# Patient Record
Sex: Female | Born: 1995 | Race: White | Hispanic: No | Marital: Single | Smoking: Never smoker
Health system: Southern US, Community
[De-identification: ages and names within clinical notes are randomized; demographics above are authoritative.]

## PROBLEM LIST (undated history)

## (undated) DIAGNOSIS — Z789 Other specified health status: Secondary | ICD-10-CM

## (undated) HISTORY — PX: WISDOM TOOTH EXTRACTION: SHX21

## (undated) HISTORY — PX: NO PAST SURGERIES: SHX2092

---

## 2018-10-17 ENCOUNTER — Telehealth: Payer: Self-pay | Admitting: Family Medicine
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
## 2018-10-17 NOTE — Telephone Encounter (Signed)
Patient wants to know if she can have a prescription sent to her pharmacist instead of being seen in the clinic.

## 2018-10-18 ENCOUNTER — Other Ambulatory Visit: Payer: Self-pay

## 2018-10-18 ENCOUNTER — Ambulatory Visit: Payer: Self-pay | Admitting: Physician Assistant

## 2018-10-18 DIAGNOSIS — Z113 Encounter for screening for infections with a predominantly sexual mode of transmission: Secondary | ICD-10-CM

## 2018-10-18 LAB — WET PREP FOR TRICH, YEAST, CLUE
Trichomonas Exam: NEGATIVE
Yeast Exam: NEGATIVE

## 2018-10-19 ENCOUNTER — Encounter: Payer: Self-pay | Admitting: Physician Assistant

## 2018-10-19 ENCOUNTER — Ambulatory Visit: Payer: Self-pay

## 2018-10-19 NOTE — Progress Notes (Signed)
    STI clinic/screening visit  Subjective:  Diana Mejia is a 23 y.o. female being seen today for an STI screening visit. The patient reports they do have symptoms.  Patient has the following medical conditions:  There are no active problems to display for this patient.    Chief Complaint  Patient presents with  . SEXUALLY TRANSMITTED DISEASE    HPI  Patient reports that she has noticed increased white discharge with odor for a few days and "intense" itching.  States that she was using an OTC product with some relief but symptoms mostly resolved after period started.  LMP 10/16/18 and normal.  States d/c OCs because she ran out about 1 month ago and has appt with Gyn/PCP for PE and renew Rx.  Declines blood work today.  See flowsheet for further details and programmatic requirements.    The following portions of the patient's history were reviewed and updated as appropriate: allergies, current medications, past medical history, past social history, past surgical history and problem list.  Objective:  There were no vitals filed for this visit.  Physical Exam Constitutional:      General: She is not in acute distress.    Appearance: Normal appearance.  HENT:     Head: Normocephalic and atraumatic.     Mouth/Throat:     Mouth: Mucous membranes are moist.     Pharynx: Oropharynx is clear. No oropharyngeal exudate or posterior oropharyngeal erythema.  Eyes:     Conjunctiva/sclera: Conjunctivae normal.  Neck:     Musculoskeletal: Neck supple.  Pulmonary:     Effort: Pulmonary effort is normal.  Abdominal:     Palpations: Abdomen is soft. There is no mass.     Tenderness: There is no abdominal tenderness. There is no guarding or rebound.  Genitourinary:    General: Normal vulva.     Rectum: Normal.     Comments: External genitalia/pubic area without nits, lice, edema, erythema, lesions and inguinal adenopathy. Vagina with normal mucosa and moderate amount of menstrual  bleeding present. Cervix without visible lesions. Uterus firm, mobile, nt, no masses, no CMT, no adnexal tenderness or fullness. Lymphadenopathy:     Cervical: No cervical adenopathy.  Skin:    General: Skin is warm and dry.     Findings: No bruising, erythema, lesion or rash.  Neurological:     Mental Status: She is alert and oriented to person, place, and time.  Psychiatric:        Mood and Affect: Mood normal.        Behavior: Behavior normal.        Thought Content: Thought content normal.        Judgment: Judgment normal.       Assessment and Plan:  Diana Mejia is a 23 y.o. female presenting to the [facility] Department for STI screening  1. Screening for STD (sexually transmitted disease) Patient is without current symptoms.  Declines blood work today. Rec condoms with all sex. Await test results.  Counseled that RN will call if needs to RTC for any treatment once results are back. Enc patient to keep appt with PCP/Gyn as scheduled for PE, pap and BCM. - WET PREP FOR [place], YEAST, CLUE - Chlamydia/Gonorrhea [facility]     No follow-ups on file.  No future appointments.  Jerene Dilling, PA

## 2018-10-30 ENCOUNTER — Telehealth: Payer: Self-pay | Admitting: General Practice

## 2018-10-30 NOTE — Telephone Encounter (Signed)
test results

## 2018-10-31 NOTE — Telephone Encounter (Signed)
TC with patient. Verified ID via password. Informed patient of neg GC/Chlamydia results. Aileen Fass, RN

## 2019-01-01 ENCOUNTER — Other Ambulatory Visit: Payer: Self-pay

## 2019-01-01 ENCOUNTER — Telehealth: Payer: Self-pay | Admitting: Licensed Clinical Social Worker

## 2019-01-01 ENCOUNTER — Telehealth: Payer: Self-pay | Admitting: Family Medicine

## 2019-01-01 ENCOUNTER — Encounter: Payer: Self-pay | Admitting: Physician Assistant

## 2019-01-01 DIAGNOSIS — F419 Anxiety disorder, unspecified: Secondary | ICD-10-CM
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
## 2019-01-01 NOTE — Telephone Encounter (Signed)
Patient needs a referral sent to [facility] . Was told to call us. Explained to patient we are not primary care but a nurse could answer her questions as to the referral type she needs.

## 2019-01-01 NOTE — Telephone Encounter (Signed)
LCSW returned patient's call. LCSW discussed patient's concerns of anxiety/depression and request for medication evaluation. LCSW encouraged patient to start with a PCP and provided information about [facility]. LCSW encouraged patient to reach out in the future, if she decides that she would like therapy.

## 2019-01-01 NOTE — Progress Notes (Signed)
Consulted by RN re:  Patient request for referral to Psych for meds due to increased anxiety and depression.  Rec that patient contact LCSW ASAP for evaluation and referral for meds if needed.  Reviewed RN documentation of phone call and agree with documentation.

## 2019-05-06 ENCOUNTER — Inpatient Hospital Stay (HOSPITAL_COMMUNITY)
Admission: AD | Admit: 2019-05-06 | Discharge: 2019-05-06 | Disposition: A | Discharge status: Home / Self Care | Payer: Medicaid Other | Attending: Obstetrics & Gynecology | Admitting: Obstetrics & Gynecology

## 2019-05-06 ENCOUNTER — Encounter (HOSPITAL_COMMUNITY): Payer: Self-pay | Admitting: Emergency Medicine

## 2019-05-06 ENCOUNTER — Other Ambulatory Visit: Payer: Self-pay

## 2019-05-06 ENCOUNTER — Inpatient Hospital Stay (HOSPITAL_COMMUNITY): Payer: Medicaid Other

## 2019-05-06 DIAGNOSIS — N8311 Corpus luteum cyst of right ovary: Secondary | ICD-10-CM | POA: Diagnosis not present

## 2019-05-06 DIAGNOSIS — O3481 Maternal care for other abnormalities of pelvic organs, first trimester: Secondary | ICD-10-CM | POA: Insufficient documentation

## 2019-05-06 DIAGNOSIS — B9689 Other specified bacterial agents as the cause of diseases classified elsewhere: Secondary | ICD-10-CM | POA: Diagnosis not present

## 2019-05-06 DIAGNOSIS — O26891 Other specified pregnancy related conditions, first trimester: Secondary | ICD-10-CM | POA: Diagnosis not present

## 2019-05-06 DIAGNOSIS — O23591 Infection of other part of genital tract in pregnancy, first trimester: Secondary | ICD-10-CM | POA: Diagnosis not present

## 2019-05-06 DIAGNOSIS — N760 Acute vaginitis: Secondary | ICD-10-CM

## 2019-05-06 DIAGNOSIS — Z880 Allergy status to penicillin: Secondary | ICD-10-CM | POA: Diagnosis not present

## 2019-05-06 DIAGNOSIS — Z3A01 Less than 8 weeks gestation of pregnancy: Secondary | ICD-10-CM | POA: Diagnosis not present

## 2019-05-06 DIAGNOSIS — R109 Unspecified abdominal pain: Secondary | ICD-10-CM | POA: Insufficient documentation

## 2019-05-06 DIAGNOSIS — B373 Candidiasis of vulva and vagina: Secondary | ICD-10-CM

## 2019-05-06 DIAGNOSIS — O26899 Other specified pregnancy related conditions, unspecified trimester: Secondary | ICD-10-CM

## 2019-05-06 DIAGNOSIS — B3731 Acute candidiasis of vulva and vagina: Secondary | ICD-10-CM

## 2019-05-06 DIAGNOSIS — Z3490 Encounter for supervision of normal pregnancy, unspecified, unspecified trimester: Secondary | ICD-10-CM

## 2019-05-06 HISTORY — DX: Other specified health status: Z78.9

## 2019-05-06 LAB — WET PREP, GENITAL
Sperm: NONE SEEN
Trich, Wet Prep: NONE SEEN
Yeast Wet Prep HPF POC: NONE SEEN

## 2019-05-06 LAB — CBC
HCT: 40.6 % (ref 36.0–46.0)
Hemoglobin: 13.3 g/dL (ref 12.0–15.0)
MCH: 27.8 pg (ref 26.0–34.0)
MCHC: 32.8 g/dL (ref 30.0–36.0)
MCV: 84.9 fL (ref 80.0–100.0)
Platelets: 293 10*3/uL (ref 150–400)
RBC: 4.78 MIL/uL (ref 3.87–5.11)
RDW: 12.9 % (ref 11.5–15.5)
WBC: 7.7 10*3/uL (ref 4.0–10.5)
nRBC: 0 % (ref 0.0–0.2)

## 2019-05-06 LAB — URINALYSIS, ROUTINE W REFLEX MICROSCOPIC
Bilirubin Urine: NEGATIVE
Glucose, UA: NEGATIVE mg/dL
Hgb urine dipstick: NEGATIVE
Ketones, ur: 20 mg/dL — AB
Leukocytes,Ua: NEGATIVE
Nitrite: NEGATIVE
Protein, ur: NEGATIVE mg/dL
Specific Gravity, Urine: 1.015 (ref 1.005–1.030)
pH: 5 (ref 5.0–8.0)

## 2019-05-06 LAB — POC URINE PREG, ED
Preg Test, Ur: POSITIVE — AB
Preg Test, Ur: POSITIVE — AB

## 2019-05-06 LAB — HCG, QUANTITATIVE, PREGNANCY: hCG, Beta Chain, Quant, S: 535 m[IU]/mL — ABNORMAL HIGH (ref <5)

## 2019-05-06 MED ORDER — ACETAMINOPHEN 500 MG PO TABS
1000.0000 mg | ORAL_TABLET | Freq: Once | ORAL | Status: AC
  Administered 2019-05-06: 07:00:00 1000 mg via ORAL
  Filled 2019-05-06: qty 2

## 2019-05-06 MED ORDER — METRONIDAZOLE 500 MG PO TABS: 500.0000 mg | ORAL_TABLET | Freq: Two times a day (BID) | ORAL | 0 refills | Status: DC

## 2019-05-06 MED ORDER — TERCONAZOLE 0.4 % VA CREA: 1.0000 | TOPICAL_CREAM | Freq: Every day | VAGINAL | 0 refills | Status: DC

## 2019-05-06 NOTE — ED Triage Notes (Signed)
Pt reports she is [redacted] weeks pregnant and experiencing abdominal cramping.  Only other symptom is nausea.

## 2019-05-06 NOTE — MAU Note (Signed)
Pt reports to MAU c/o abdominal cramping and pressure that is a 5/10. No bleeding or discharge. Pt reports that heat and warm showers are the only things that help on occasion. Pt reports occasional stinging when urinating and frequency. LMP was March 16th 2021.

## 2019-05-06 NOTE — ED Notes (Signed)
RN called PA after positive preg test resulted.  PA will see pt in triage to facilitate transfer to MAU

## 2019-05-06 NOTE — ED Provider Notes (Signed)
MSE was initiated and I personally evaluated the patient and placed orders (if any) at  3:52 AM on May 06, 2019.  The patient appears stable so that the remainder of the MSE may be completed by another provider.  24 year old G1, P0 female who is a last menstrual cycle was 04/01/2019 who presents with bilateral lower abdomen pain and cramping.  Pain has been worsening since onset.  She also endorses nausea.  No vaginal bleeding, discharge, fever, or chills.   States that she stopped taking her OCP in February, but had been taking it on and off for the last few months.  Vital signs are unremarkable in the ER.  On exam, abdomen is gravid.  She is tender to palpation in the bilateral lower quadrants without rebound or guarding.  Well-appearing.  No acute distress.  She is tearful and appears anxious.  Spoke with Gerrit Heck, CNM, who will accept the patient for transfer to MAU.    Barkley Boards, PA-C 05/06/19 0357    Nira Conn, MD 05/06/19 [phone]

## 2019-05-06 NOTE — MAU Provider Note (Signed)
History     CSN: [id number]  Arrival date and time: 05/06/19 0149   First Provider Initiated Contact with Patient 05/06/19 0444      Chief Complaint  Patient presents with  . Abdominal Cramping  . Possible Pregnancy   Ortha Metts is a 24 y.o. G1P0 at [redacted]w[redacted]d Unsure LMP, but thinks her LMP was between March 14-16.  She presents today for Abdominal Cramping.  She reports she has been experiencing lower abdominal pressure for the past 4 days.  She states the pressure is intermittent, but regularly occurs around [place].  She states the pain is so intense that she has to take a shower or apply a heating pad for relief. She reports the pain is worsened by movement.  She reports the pain is a 5/10 and she has not attempted to take any OTC medications for the pain.       OB History    Gravida  1   Para      Term      Preterm      AB      Living        SAB      TAB      Ectopic      Multiple      Live Births              Past Medical History:  Diagnosis Date  . Medical history non-contributory     Past Surgical History:  Procedure Laterality Date  . NO PAST SURGERIES      History reviewed. No pertinent family history.  Social History   Tobacco Use  . Smoking status: Never Smoker  . Smokeless tobacco: Never Used  Substance Use Topics  . Alcohol use: Not Currently    Comment: occasionally  . Drug use: Never    Allergies:  Allergies  Allergen Reactions  . Penicillins Hives    Medications Prior to Admission  Medication Sig Dispense Refill Last Dose  . Prenatal Vit-Fe Fumarate-FA (MULTIVITAMIN-PRENATAL) 27-0.8 MG TABS tablet Take 1 tablet by mouth daily at 12 noon.   05/05/2019 at Unknown time    Review of Systems  Constitutional: Negative for chills and fever.  Respiratory: Negative for cough and shortness of breath.   Gastrointestinal: Positive for abdominal pain and nausea. Negative for constipation, diarrhea and vomiting.  Genitourinary:  Positive for dysuria ("Stinging once or twice," but none currently). Negative for difficulty urinating, pelvic pain, vaginal bleeding and vaginal discharge.  Musculoskeletal: Negative for back pain.  Neurological: Negative for dizziness, light-headedness and headaches (Migraine history-one this week, none currently. ).   Physical Exam   Blood pressure 117/75, pulse 85, temperature 98.6 F (37 C), temperature source Oral, resp. rate 18, height 5\' 3"  (1.6 m), weight 51.4 kg, last menstrual period 04/03/2019, SpO2 99 %.  Physical Exam  Constitutional: She is oriented to person, place, and time. She appears well-developed and well-nourished.  HENT:  Head: Normocephalic and atraumatic.  Eyes: Conjunctivae are normal.  Cardiovascular: Normal rate.  Respiratory: Effort normal.  GI: Soft. There is abdominal tenderness in the right upper quadrant and left upper quadrant.  Genitourinary: Uterus is not enlarged and not tender. Cervix exhibits no motion tenderness and no discharge.    Vaginal discharge (Moderate amt thick white curdy discharge) present.     Genitourinary Comments: Speculum Exam: -Normal External Genitalia: Non tender. Urethra meatus appears inflamed with white plaques noted. Scant amt watery gray discharge noted at introitus.   -Vaginal Vault:  Pink mucosa with good rugae. Moderate amt thick white curdy discharge -wet prep collected -Cervix:Pink, no lesions, cysts, or polyps.  Appears closed, Nulliparous. No active bleeding from os-GC/CT collected -Bimanual Exam:  Normal uterine size. Mild tenderness in left cul de sac. Adnexa without masses or tenderness bilaterally.      Musculoskeletal:        General: Normal range of motion.     Cervical back: Normal range of motion.  Neurological: She is alert and oriented to person, place, and time.  Skin: Skin is warm and dry.  Psychiatric: Her speech is normal and behavior is normal. Thought content normal. Her mood appears anxious.     MAU Course  Procedures Results for orders placed or performed during the hospital encounter of 05/06/19 (from the past 24 hour(s))  POC Urine Pregnancy, ED (not at [facility]     Status: Abnormal   Collection Time: 05/06/19  3:14 AM  Result Value Ref Range   Preg Test, Ur POSITIVE (A) NEGATIVE  POC urine preg, ED (not at [facility])     Status: Abnormal   Collection Time: 05/06/19  3:25 AM  Result Value Ref Range   Preg Test, Ur POSITIVE (A) NEGATIVE  Wet prep, genital     Status: Abnormal   Collection Time: 05/06/19  5:03 AM  Result Value Ref Range   Yeast Wet Prep HPF POC NONE SEEN NONE SEEN   Trich, Wet Prep NONE SEEN NONE SEEN   Clue Cells Wet Prep HPF POC PRESENT (A) NONE SEEN   WBC, Wet Prep HPF POC MANY (A) NONE SEEN   Sperm NONE SEEN   Urinalysis, Routine w reflex microscopic     Status: Abnormal   Collection Time: 05/06/19  5:09 AM  Result Value Ref Range   Color, Urine YELLOW YELLOW   APPearance CLEAR CLEAR   Specific Gravity, Urine 1.015 1.005 - 1.030   pH 5.0 5.0 - 8.0   Glucose, UA NEGATIVE NEGATIVE mg/dL   Hgb urine dipstick NEGATIVE NEGATIVE   Bilirubin Urine NEGATIVE NEGATIVE   Ketones, ur 20 (A) NEGATIVE mg/dL   Protein, ur NEGATIVE NEGATIVE mg/dL   Nitrite NEGATIVE NEGATIVE   Leukocytes,Ua NEGATIVE NEGATIVE  ABO/Rh     Status: None   Collection Time: 05/06/19  5:27 AM  Result Value Ref Range   ABO/RH(D)      A NEG Performed at [facility], [address] [place], [place] [address]    US OB LESS THAN 14 WEEKS WITH [place] TRANSVAGINAL  Result Date: 05/06/2019 CLINICAL DATA:  Abdominal pain. Beta hCG pending. Four weeks 5 days by last menstrual. EXAM: OBSTETRIC <14 WK [place] AND TRANSVAGINAL OB US TECHNIQUE: Both transabdominal and transvaginal ultrasound examinations were performed for complete evaluation of the gestation as well as the maternal uterus, adnexal regions, and pelvic cul-de-sac. Transvaginal technique was performed to assess early pregnancy.  COMPARISON:  None. FINDINGS: Intrauterine gestational sac: Absent Maternal uterus/adnexae: Normal left ovary. A right ovarian corpus luteal cyst measures on the order of 3.0 cm. Trace free pelvic fluid is likely physiologic. IMPRESSION: Absent intrauterine gestational sac. Given lack of beta HCG and early gestational age by last menstrual, this most likely represents an early intrauterine pregnancy. Missed abortion or otherwise occult ectopic cannot be excluded. Right ovarian corpus luteal cyst. Electronically Signed   By: Jeronimo Greaves M.D.   On: 05/06/2019 06:27    MDM Pelvic Exam; Wet Prep and GC/CT Labs: UA, UPT, CBC, hCG, ABO Ultrasound Pain  Medication Assessment and Plan  24 year old  G1P0 at 4.5 weeks Abdominal Pain  -POC reviewed. -Introduced to instruments for pelvic exam. -Exam performed and findings discussed. -Informed that findings reflective of yeast and patient reports some intermittent vaginal itching. -Discussed that treatment would be sent regardless of microscopic findings.  -Cultures collected and pending.  -Labs ordered -Patient offered and accepts pain medication. -Tylenol ordered -Will send for [place] and await results.   Cherre Robins 05/06/2019, 4:44 AM   Reassessment (6:43 AM) Bacterial Vaginosis Early Pregnancy-Unknown Anatomical Location  -Labs and [place] results reviewed. -Due to laboratory back up, hCG and CBC has yet to be located, but drawn by phlebotomist as witnessed by RN. -Provider to bedside to discuss results and POC. -Patient reports improvement in pain.  -Patient informed that labs still pending and if unable to result, she will be contacted by provider on Monday morning for repeat lab at [facility].  -Otherwise patient should plan for repeat quant on Tuesday at [facility].  -Patient agreeable with plan and scheduled for Tuesday April 20th at 0830am -Patient questions what she needs to do to activate pregnancy medicaid and informed that  verification paperwork would be given.  -Discussed bacterial vaginosis findings and treatment.  Instructed to treat BV first and then yeast infection. -Rx for Flagyl and Terazol 7 sent to pharmacy on file.  -Patient expresses gratitude for care received today and has no other questions or concerns. -Encouraged to call or return to MAU if symptoms worsen or with the onset of new symptoms. -Discharged to home in improved condition.  Cherre Robins MSN, CNM Advanced Practice Provider, Center for [facility]   6:49 AM CBC and hCG located and pending

## 2019-05-06 NOTE — Discharge Instructions (Signed)
Vaginal Yeast Infection, Adult  Vaginal yeast infection is a condition that causes vaginal discharge as well as soreness, swelling, and redness (inflammation) of the vagina. This is a common condition. Some women get this infection frequently. What are the causes? This condition is caused by a change in the normal balance of the yeast (candida) and bacteria that live in the vagina. This change causes an overgrowth of yeast, which causes the inflammation. What increases the risk? The condition is more likely to develop in women who:  Take antibiotic medicines.  Have diabetes.  Take birth control pills.  Are pregnant.  Douche often.  Have a weak body defense system (immune system).  Have been taking steroid medicines for a long time.  Frequently wear tight clothing. What are the signs or symptoms? Symptoms of this condition include:  White, thick, creamy vaginal discharge.  Swelling, itching, redness, and irritation of the vagina. The lips of the vagina (vulva) may be affected as well.  Pain or a burning feeling while urinating.  Pain during sex. How is this diagnosed? This condition is diagnosed based on:  Your medical history.  A physical exam.  A pelvic exam. Your health care provider will examine a sample of your vaginal discharge under a microscope. Your health care provider may send this sample for testing to confirm the diagnosis. How is this treated? This condition is treated with medicine. Medicines may be over-the-counter or prescription. You may be told to use one or more of the following:  Medicine that is taken by mouth (orally).  Medicine that is applied as a cream (topically).  Medicine that is inserted directly into the vagina (suppository). Follow these instructions at home:  Lifestyle  Do not have sex until your health care provider approves. Tell your sex partner that you have a yeast infection. That person should go to his or her health care  provider and ask if they should also be treated.  Do not wear tight clothes, such as pantyhose or tight pants.  Wear breathable cotton underwear. General instructions  Take or apply over-the-counter and prescription medicines only as told by your health care provider.  Eat more yogurt. This may help to keep your yeast infection from returning.  Do not use tampons until your health care provider approves.  Try taking a sitz bath to help with discomfort. This is a warm water bath that is taken while you are sitting down. The water should only come up to your hips and should cover your buttocks. Do this 3-4 times per day or as told by your health care provider.  Do not douche.  If you have diabetes, keep your blood sugar levels under control.  Keep all follow-up visits as told by your health care provider. This is important. Contact a health care provider if:  You have a fever.  Your symptoms go away and then return.  Your symptoms do not get better with treatment.  Your symptoms get worse.  You have new symptoms.  You develop blisters in or around your vagina.  You have blood coming from your vagina and it is not your menstrual period.  You develop pain in your abdomen. Summary  Vaginal yeast infection is a condition that causes discharge as well as soreness, swelling, and redness (inflammation) of the vagina.  This condition is treated with medicine. Medicines may be over-the-counter or prescription.  Take or apply over-the-counter and prescription medicines only as told by your health care provider.  Do not douche.   Do not have sex or use tampons until your health care provider approves.  Contact a health care provider if your symptoms do not get better with treatment or your symptoms go away and then return. This information is not intended to replace advice given to you by your health care provider. Make sure you discuss any questions you have with your health care  provider. Document Revised: 08/04/2018 Document Reviewed: 05/23/2017 Elsevier Patient Education  [place]. Bacterial Vaginosis  Bacterial vaginosis is a vaginal infection that occurs when the normal balance of bacteria in the vagina is disrupted. It results from an overgrowth of certain bacteria. This is the most common vaginal infection among women ages 75-44. Because bacterial vaginosis increases your risk for STIs (sexually transmitted infections), getting treated can help reduce your risk for chlamydia, gonorrhea, herpes, and HIV (human immunodeficiency virus). Treatment is also important for preventing complications in pregnant women, because this condition can cause an early (premature) delivery. What are the causes? This condition is caused by an increase in harmful bacteria that are normally present in small amounts in the vagina. However, the reason that the condition develops is not fully understood. What increases the risk? The following factors may make you more likely to develop this condition:  Having a new sexual partner or multiple sexual partners.  Having unprotected sex.  Douching.  Having an intrauterine device (IUD).  Smoking.  Drug and alcohol abuse.  Taking certain antibiotic medicines.  Being pregnant. You cannot get bacterial vaginosis from toilet seats, bedding, swimming pools, or contact with objects around you. What are the signs or symptoms? Symptoms of this condition include:  Grey or white vaginal discharge. The discharge can also be watery or foamy.  A fish-like odor with discharge, especially after sexual intercourse or during menstruation.  Itching in and around the vagina.  Burning or pain with urination. Some women with bacterial vaginosis have no signs or symptoms. How is this diagnosed? This condition is diagnosed based on:  Your medical history.  A physical exam of the vagina.  Testing a sample of vaginal fluid under a  microscope to look for a large amount of bad bacteria or abnormal cells. Your health care provider may use a cotton swab or a small wooden spatula to collect the sample. How is this treated? This condition is treated with antibiotics. These may be given as a pill, a vaginal cream, or a medicine that is put into the vagina (suppository). If the condition comes back after treatment, a second round of antibiotics may be needed. Follow these instructions at home: Medicines  Take over-the-counter and prescription medicines only as told by your health care provider.  Take or use your antibiotic as told by your health care provider. Do not stop taking or using the antibiotic even if you start to feel better. General instructions  If you have a female sexual partner, tell her that you have a vaginal infection. She should see her health care provider and be treated if she has symptoms. If you have a female sexual partner, he does not need treatment.  During treatment: ? Avoid sexual activity until you finish treatment. ? Do not douche. ? Avoid alcohol as directed by your health care provider. ? Avoid breastfeeding as directed by your health care provider.  Drink enough water and fluids to keep your urine clear or pale yellow.  Keep the area around your vagina and rectum clean. ? Wash the area daily with warm water. ? Wipe  yourself from front to back after using the toilet.  Keep all follow-up visits as told by your health care provider. This is important. How is this prevented?  Do not douche.  Wash the outside of your vagina with warm water only.  Use protection when having sex. This includes latex condoms and dental dams.  Limit how many sexual partners you have. To help prevent bacterial vaginosis, it is best to have sex with just one partner (monogamous).  Make sure you and your sexual partner are tested for STIs.  Wear cotton or cotton-lined underwear.  Avoid wearing tight pants and  pantyhose, especially during summer.  Limit the amount of alcohol that you drink.  Do not use any products that contain nicotine or tobacco, such as cigarettes and e-cigarettes. If you need help quitting, ask your health care provider.  Do not use illegal drugs. Where to find more information  Centers for Disease Control and Prevention: SolutionApps.co.za  American Sexual Health Association (ASHA): www.ashastd.org  [place] Department of Health and Health and safety inspector, Office on Women's Health: ConventionalMedicines.si or http://www.anderson-williamson.info/ Contact a health care provider if:  Your symptoms do not improve, even after treatment.  You have more discharge or pain when urinating.  You have a fever.  You have pain in your abdomen.  You have pain during sex.  You have vaginal bleeding between periods. Summary  Bacterial vaginosis is a vaginal infection that occurs when the normal balance of bacteria in the vagina is disrupted.  Because bacterial vaginosis increases your risk for STIs (sexually transmitted infections), getting treated can help reduce your risk for chlamydia, gonorrhea, herpes, and HIV (human immunodeficiency virus). Treatment is also important for preventing complications in pregnant women, because the condition can cause an early (premature) delivery.  This condition is treated with antibiotic medicines. These may be given as a pill, a vaginal cream, or a medicine that is put into the vagina (suppository). This information is not intended to replace advice given to you by your health care provider. Make sure you discuss any questions you have with your health care provider. Document Revised: 12/17/2016 Document Reviewed: 09/20/2015 Elsevier Patient Education  2020 [employer]. Human Chorionic Gonadotropin Test Why am I having this test? A human chorionic gonadotropin (hCG) test is done to determine whether you are pregnant. It can also be  used:  To diagnose an abnormal pregnancy.  To determine whether you have had a failed pregnancy (miscarriage) or are at risk of one. What is being tested? This test checks the level of the human chorionic gonadotropin (hCG) hormone in the blood. This hormone is produced during pregnancy by the cells that form the placenta. The placenta is the organ that grows inside your womb (uterus) to nourish a developing baby. When you are pregnant, hCG can be detected in your blood or urine 7 to 8 days before your missed period. It continues to go up for the first 8-10 weeks of pregnancy. The presence of hCG in your blood can be measured with several different types of tests. You may have:  A urine test. ? Because this hormone is eliminated from your body by your kidneys, you may have a urine test to find out whether you are pregnant. A home pregnancy test detects whether there is hCG in your urine. ? A urine test only shows whether there is hCG in your urine. It does not measure how much.  A qualitative blood test. ? You may have this type of blood test to  find out if you are pregnant. ? This blood test only shows whether there is hCG in your blood. It does not measure how much.  A quantitative blood test. ? This type of blood test measures the amount of hCG in your blood. ? You may have this test to:  Diagnose an abnormal pregnancy.  Check whether you have had a miscarriage.  Determine whether you are at risk of a miscarriage. What kind of sample is taken?     Two kinds of samples may be collected to test for the hCG hormone.  Blood. It is usually collected by inserting a needle into a blood vessel.  Urine. It is usually collected by urinating into a germ-free (sterile) specimen cup. It is best to collect the sample the first time you urinate in the morning. How do I prepare for this test? No preparation is needed for a blood test.  For the urine test:  Let your health care provider know  about: ? All medicines you are taking, including vitamins, herbs, creams, and over-the-counter medicines. ? Any blood in your urine. This may interfere with the result.  Do not drink too much fluid. Drink as you normally would, or as directed by your health care provider. How are the results reported? Depending on the type of test that you have, your test results may be reported as values. Your health care provider will compare your results to normal ranges that were established after testing a large group of people (reference ranges). Reference ranges may vary among labs and hospitals. For this test, common reference ranges that show absence of pregnancy are:  Quantitative hCG blood levels: less than 5 IU/L. Other results will be reported as either positive or negative. For this test, normal results (meaning the absence of pregnancy) are:  Negative for hCG in the urine test.  Negative for hCG in the qualitative blood test. What do the results mean? Urine and qualitative blood test  A negative result could mean: ? That you are not pregnant. ? That the test was done too early in your pregnancy to detect hCG in your blood or urine. If you still have other signs of pregnancy, the test will be repeated.  A positive result means: ? That you are most likely pregnant. Your health care provider may confirm your pregnancy with an imaging study (ultrasound) of your uterus, if needed. Quantitative blood test Results of the quantitative hCG blood test will be interpreted as follows:  Less than 5 IU/L: You are most likely not pregnant.  Greater than 25 IU/L: You are most likely pregnant.  hCG levels that are higher than expected: ? You are pregnant with twins. ? You have abnormal growths in the uterus.  hCG levels that are rising more slowly than expected: ? You have an ectopic pregnancy (also called a tubal pregnancy).  hCG levels that are falling: ? You may be having a miscarriage. Talk  with your health care provider about what your results mean. Questions to ask your health care provider Ask your health care provider, or the department that is doing the test:  When will my results be ready?  How will I get my results?  What are my treatment options?  What other tests do I need?  What are my next steps? Summary  A human chorionic gonadotropin test is done to determine whether you are pregnant.  When you are pregnant, hCG can be detected in your blood or urine 7 to 8 days  before your missed period. It continues to go up for the first 8-10 weeks of pregnancy.  Your hCG level can be measured with different types of tests. You may have a urine test, a qualitative blood test, or a quantitative blood test.  Talk with your health care provider about what your results mean. This information is not intended to replace advice given to you by your health care provider. Make sure you discuss any questions you have with your health care provider. Document Revised: 12/06/2016 Document Reviewed: 12/06/2016 Elsevier Patient Education  2020 [employer]. Advance Directive  Advance directives are legal documents that let you make choices ahead of time about your health care and medical treatment in case you become unable to communicate for yourself. Advance directives are a way for you to make known your wishes to family, friends, and health care providers. This can let others know about your end-of-life care if you become unable to communicate. Discussing and writing advance directives should happen over time rather than all at once. Advance directives can be changed depending on your situation and what you want, even after you have signed the advance directives. There are different types of advance directives, such as:  Medical power of attorney.  Living will.  Do not resuscitate (DNR) or do not attempt resuscitation (DNAR) order. Health care proxy and medical power of  attorney A health care proxy is also called a health care agent. This is a person who is appointed to make medical decisions for you in cases where you are unable to make the decisions yourself. Generally, people choose someone they know well and trust to represent their preferences. Make sure to ask this person for an agreement to act as your proxy. A proxy may have to exercise judgment in the event of a medical decision for which your wishes are not known. A medical power of attorney is a legal document that names your health care proxy. Depending on the laws in your state, after the document is written, it may also need to be:  Signed.  Notarized.  Dated.  Copied.  Witnessed.  Incorporated into your medical record. You may also want to appoint someone to manage your money in a situation in which you are unable to do so. This is called a durable power of attorney for finances. It is a separate legal document from the durable power of attorney for health care. You may choose the same person or someone different from your health care proxy to act as your agent in money matters. If you do not appoint a proxy, or if there is a concern that the proxy is not acting in your best interests, a court may appoint a guardian to act on your behalf. Living will A living will is a set of instructions that state your wishes about medical care when you cannot express them yourself. Health care providers should keep a copy of your living will in your medical record. You may want to give a copy to family members or friends. To alert caregivers in case of an emergency, you can place a card in your wallet to let them know that you have a living will and where they can find it. A living will is used if you become:  Terminally ill.  Disabled.  Unable to communicate or make decisions. Items to consider in your living will include:  To use or not to use life-support equipment, such as dialysis machines and  breathing machines (ventilators).  A DNR or DNAR order. This tells health care providers not to use cardiopulmonary resuscitation (CPR) if breathing or heartbeat stops.  To use or not to use tube feeding.  To be given or not to be given food and fluids.  Comfort (palliative) care when the goal becomes comfort rather than a cure.  Donation of organs and tissues. A living will does not give instructions for distributing your money and property if you should pass away. DNR or DNAR A DNR or DNAR order is a request not to have CPR in the event that your heart stops beating or you stop breathing. If a DNR or DNAR order has not been made and shared, a health care provider will try to help any patient whose heart has stopped or who has stopped breathing. If you plan to have surgery, talk with your health care provider about how your DNR or DNAR order will be followed if problems occur. What if I do not have an advance directive? If you do not have an advance directive, some states assign family decision makers to act on your behalf based on how closely you are related to them. Each state has its own laws about advance directives. You may want to check with your health care provider, attorney, or state representative about the laws in your state. Summary  Advance directives are the legal documents that allow you to make choices ahead of time about your health care and medical treatment in case you become unable to tell others about your care.  The process of discussing and writing advance directives should happen over time. You can change the advance directives, even after you have signed them.  Advance directives include DNR or DNAR orders, living wills, and designating an agent as your medical power of attorney. This information is not intended to replace advice given to you by your health care provider. Make sure you discuss any questions you have with your health care provider. Document Revised:  08/03/2018 Document Reviewed: 08/03/2018 Elsevier Patient Education  [place].

## 2019-05-06 NOTE — ED Notes (Signed)
PA informed RN that pt has been accepted by [facility].  RN called report to [facility] RN.  Transport is coming to pick up pt.

## 2019-05-07 LAB — ABO/RH
ABO/RH(D): A NEG
Antibody Screen: NEGATIVE

## 2019-05-07 LAB — GC/CHLAMYDIA PROBE AMP (~~LOC~~) NOT AT ARMC
Chlamydia: NEGATIVE
Comment: NEGATIVE
Comment: NORMAL
Neisseria Gonorrhea: NEGATIVE

## 2019-05-08 ENCOUNTER — Ambulatory Visit (INDEPENDENT_AMBULATORY_CARE_PROVIDER_SITE_OTHER): Payer: 59 | Admitting: *Deleted

## 2019-05-08 ENCOUNTER — Other Ambulatory Visit: Payer: Self-pay

## 2019-05-08 DIAGNOSIS — Z3200 Encounter for pregnancy test, result unknown: Secondary | ICD-10-CM

## 2019-05-08 LAB — HCG, QUANTITATIVE, PREGNANCY: HCG, Total, QN: 1650 m[IU]/mL

## 2019-05-08 NOTE — Progress Notes (Signed)
Pt here for [facility] lab only

## 2019-05-12 ENCOUNTER — Encounter (HOSPITAL_COMMUNITY): Payer: Self-pay | Admitting: Obstetrics and Gynecology

## 2019-05-12 ENCOUNTER — Inpatient Hospital Stay (HOSPITAL_COMMUNITY)
Admission: AD | Admit: 2019-05-12 | Discharge: 2019-05-12 | Disposition: A | Discharge status: Home / Self Care | Payer: Medicaid Other | Attending: Obstetrics and Gynecology | Admitting: Obstetrics and Gynecology

## 2019-05-12 ENCOUNTER — Inpatient Hospital Stay (HOSPITAL_COMMUNITY): Payer: Medicaid Other

## 2019-05-12 ENCOUNTER — Other Ambulatory Visit: Payer: Self-pay

## 2019-05-12 DIAGNOSIS — R1031 Right lower quadrant pain: Secondary | ICD-10-CM | POA: Insufficient documentation

## 2019-05-12 DIAGNOSIS — O26891 Other specified pregnancy related conditions, first trimester: Secondary | ICD-10-CM | POA: Diagnosis not present

## 2019-05-12 DIAGNOSIS — Z880 Allergy status to penicillin: Secondary | ICD-10-CM | POA: Diagnosis not present

## 2019-05-12 DIAGNOSIS — R109 Unspecified abdominal pain: Secondary | ICD-10-CM | POA: Diagnosis present

## 2019-05-12 DIAGNOSIS — Z3490 Encounter for supervision of normal pregnancy, unspecified, unspecified trimester: Secondary | ICD-10-CM

## 2019-05-12 DIAGNOSIS — Z3A01 Less than 8 weeks gestation of pregnancy: Secondary | ICD-10-CM | POA: Diagnosis not present

## 2019-05-12 DIAGNOSIS — O208 Other hemorrhage in early pregnancy: Secondary | ICD-10-CM | POA: Diagnosis not present

## 2019-05-12 NOTE — Discharge Instructions (Signed)
First Trimester of Pregnancy  The first trimester of pregnancy is from week 1 until the end of week 13 (months 1 through 3). During this time, your baby will begin to develop inside you. At 6-8 weeks, the eyes and face are formed, and the heartbeat can be seen on ultrasound. At the end of 12 weeks, all the baby's organs are formed. Prenatal care is all the medical care you receive before the birth of your baby. Make sure you get good prenatal care and follow all of your doctor's instructions. Follow these instructions at home: Medicines  Take over-the-counter and prescription medicines only as told by your doctor. Some medicines are safe and some medicines are not safe during pregnancy.  Take a prenatal vitamin that contains at least 600 micrograms (mcg) of folic acid.  If you have trouble pooping (constipation), take medicine that will make your stool soft (stool softener) if your doctor approves. Eating and drinking   Eat regular, healthy meals.  Your doctor will tell you the amount of weight gain that is right for you.  Avoid raw meat and uncooked cheese.  If you feel sick to your stomach (nauseous) or throw up (vomit): ? Eat 4 or 5 small meals a day instead of 3 large meals. ? Try eating a few soda crackers. ? Drink liquids between meals instead of during meals.  To prevent constipation: ? Eat foods that are high in fiber, like fresh fruits and vegetables, whole grains, and beans. ? Drink enough fluids to keep your pee (urine) clear or pale yellow. Activity  Exercise only as told by your doctor. Stop exercising if you have cramps or pain in your lower belly (abdomen) or low back.  Do not exercise if it is too hot, too humid, or if you are in a place of great height (high altitude).  Try to avoid standing for long periods of time. Move your legs often if you must stand in one place for a long time.  Avoid heavy lifting.  Wear low-heeled shoes. Sit and stand up  straight.  You can have sex unless your doctor tells you not to. Relieving pain and discomfort  Wear a good support bra if your breasts are sore.  Take warm water baths (sitz baths) to soothe pain or discomfort caused by hemorrhoids. Use hemorrhoid cream if your doctor says it is okay.  Rest with your legs raised if you have leg cramps or low back pain.  If you have puffy, bulging veins (varicose veins) in your legs: ? Wear support hose or compression stockings as told by your doctor. ? Raise (elevate) your feet for 15 minutes, 3-4 times a day. ? Limit salt in your food. Prenatal care  Schedule your prenatal visits by the twelfth week of pregnancy.  Write down your questions. Take them to your prenatal visits.  Keep all your prenatal visits as told by your doctor. This is important. Safety  Wear your seat belt at all times when driving.  Make a list of emergency phone numbers. The list should include numbers for family, friends, the hospital, and police and fire departments. General instructions  Ask your doctor for a referral to a local prenatal class. Begin classes no later than at the start of month 6 of your pregnancy.  Ask for help if you need counseling or if you need help with nutrition. Your doctor can give you advice or tell you where to go for help.  Do not use hot tubs, steam   rooms, or saunas.  Do not douche or use tampons or scented sanitary pads.  Do not cross your legs for long periods of time.  Avoid all herbs and alcohol. Avoid drugs that are not approved by your doctor.  Do not use any tobacco products, including cigarettes, chewing tobacco, and electronic cigarettes. If you need help quitting, ask your doctor. You may get counseling or other support to help you quit.  Avoid cat litter boxes and soil used by cats. These carry germs that can cause birth defects in the baby and can cause a loss of your baby (miscarriage) or stillbirth.  Visit your dentist.  At home, brush your teeth with a soft toothbrush. Be gentle when you floss. Contact a doctor if:  You are dizzy.  You have mild cramps or pressure in your lower belly.  You have a nagging pain in your belly area.  You continue to feel sick to your stomach, you throw up, or you have watery poop (diarrhea).  You have a bad smelling fluid coming from your vagina.  You have pain when you pee (urinate).  You have increased puffiness (swelling) in your face, hands, legs, or ankles. Get help right away if:  You have a fever.  You are leaking fluid from your vagina.  You have spotting or bleeding from your vagina.  You have very bad belly cramping or pain.  You gain or lose weight rapidly.  You throw up blood. It may look like coffee grounds.  You are around people who have [place] measles, fifth disease, or chickenpox.  You have a very bad headache.  You have shortness of breath.  You have any kind of trauma, such as from a fall or a car accident. Summary  The first trimester of pregnancy is from week 1 until the end of week 13 (months 1 through 3).  To take care of yourself and your unborn baby, you will need to eat healthy meals, take medicines only if your doctor tells you to do so, and do activities that are safe for you and your baby.  Keep all follow-up visits as told by your doctor. This is important as your doctor will have to ensure that your baby is healthy and growing well. This information is not intended to replace advice given to you by your health care provider. Make sure you discuss any questions you have with your health care provider. Document Revised: 04/27/2018 Document Reviewed: 01/13/2016 Elsevier Patient Education  2020 [employer] Inc[employer]   Subchorionic Hematoma  A subchorionic hematoma is a gathering of blood between the outer wall of the embryo (chorion) and the inner wall of the womb (uterus). This condition can cause vaginal bleeding. If they cause  little or no vaginal bleeding, early small hematomas usually shrink on their own and do not affect your baby or pregnancy.  What are the causes? The exact cause of this condition is not known. It occurs when blood is trapped between the placenta and the uterine wall because the placenta has separated from the original site of implantation. What increases the risk? You are more likely to develop this condition if:  You were treated with fertility medicines.  You conceived through in vitro fertilization (IVF). What are the signs or symptoms? Symptoms of this condition include:  Vaginal spotting or bleeding.  Contractions of the uterus. These cause abdominal pain. Sometimes you may have no symptoms and the bleeding may only be seen when ultrasound images are taken (transvaginal ultrasound).  How is this diagnosed? This condition is diagnosed based on a physical exam. This includes a pelvic exam. You may also have other tests, including:  Blood tests.  Urine tests.  Ultrasound of the abdomen. How is this treated? Treatment for this condition can vary. Treatment may include:  Watchful waiting. You will be monitored closely for any changes in bleeding. During this stage: ? The hematoma may be reabsorbed by the body. ? The hematoma may separate the fluid-filled space containing the embryo (gestational sac) from the wall of the womb (endometrium).  Medicines.  Activity restriction. This may be needed until the bleeding stops. Follow these instructions at home:  Stay on bed rest if told to do so by your health care provider.  Do not lift anything that is heavier than 10 lbs. (4.5 kg) or as told by your health care provider.  Do not use any products that contain nicotine or tobacco, such as cigarettes and e-cigarettes. If you need help quitting, ask your health care provider.  Track and write down the number of pads you use each day and how soaked (saturated) they are.  Do not use  tampons.  Keep all follow-up visits as told by your health care provider. This is important. Your health care provider may ask you to have follow-up blood tests or ultrasound tests or both. Contact a health care provider if:  You have any vaginal bleeding.  You have a fever. Get help right away if:  You have severe cramps in your stomach, back, abdomen, or pelvis.  You pass large clots or tissue. Save any tissue for your health care provider to look at.  You have more vaginal bleeding, and you faint or become lightheaded or weak. Summary  A subchorionic hematoma is a gathering of blood between the outer wall of the placenta and the uterus.  This condition can cause vaginal bleeding.  Sometimes you may have no symptoms and the bleeding may only be seen when ultrasound images are taken.  Treatment may include watchful waiting, medicines, or activity restriction. This information is not intended to replace advice given to you by your health care provider. Make sure you discuss any questions you have with your health care provider. Document Revised: 12/17/2016 Document Reviewed: 03/02/2016 Elsevier Patient Education  2020 [employer].

## 2019-05-12 NOTE — MAU Note (Signed)
Pt reports she was here Sunday for the same reason. Still having abd pain. Wants to make sure everything is all right since they could not see a heart beat last week. Had f/u BHCG this week and level was rising appropriately. Has f/u U/S on May 11.  Pt stated the pain is sharp and ist comes and goes. Was very strong a bout an hour ago but is mild now.. Denies any vag bleeding or discharge at this time.

## 2019-05-12 NOTE — MAU Provider Note (Signed)
History     CSN: [id number]  Arrival date and time: 05/12/19 2025   First Provider Initiated Contact with Patient 05/12/19 2107      Chief Complaint  Patient presents with  . Abdominal Pain   HPI Diana Mejia is a 24 y.o. G1P0 at [redacted]w[redacted]d with pregnancy of unknown location. She presents to MAU with chief complaint of bilateral lower abdominal pain, recurrent, onset 05/02/2019 and unchanged since that time. Patient is s/p evaluation in MAU 05/06/2019. She states she is feeling extremely anxious about possible ectopic pregnancy. Her pain waxes and wanes throughout the day and is somewhat relieved by hot showers and heating pads. She denies abdominal tenderness, vaginal bleeding, fever or recent illness.   Patient states she is s/p repeat Quant hCG at [facility] and verbalizes understanding that this is an encouraging sign. 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  OB History    Gravida  1   Para      Term      Preterm      AB      Living        SAB      TAB      Ectopic      Multiple      Live Births              Past Medical History:  Diagnosis Date  . Medical history non-contributory     Past Surgical History:  Procedure Laterality Date  . NO PAST SURGERIES      History reviewed. No pertinent family history.  Social History   Tobacco Use  . Smoking status: Never Smoker  . Smokeless tobacco: Never Used  Substance Use Topics  . Alcohol use: Not Currently    Comment: occasionally  . Drug use: Never    Allergies:  Allergies  Allergen Reactions  . Penicillins Hives    Medications Prior to Admission  Medication Sig Dispense Refill Last Dose  . metroNIDAZOLE (FLAGYL) 500 MG tablet Take 1 tablet (500 mg total) by mouth 2 (two) times daily. 14 tablet 0 05/11/2019 at Unknown time  . Prenatal Vit-Fe Fumarate-FA (MULTIVITAMIN-PRENATAL) 27-0.8 MG TABS tablet Take 1 tablet by mouth daily at 12 noon.   05/12/2019 at Unknown time  . terconazole  (TERAZOL 7) 0.4 % vaginal cream Place 1 applicator vaginally at bedtime. 45 g 0 05/11/2019 at Unknown time    Review of Systems  Gastrointestinal: Positive for abdominal pain and nausea.  Genitourinary: Negative for vaginal bleeding.  All other systems reviewed and are negative.  Physical Exam   Blood pressure 111/69, pulse 77, temperature 99 F (37.2 C), resp. rate 18, height 5\' 3"  (1.6 m), weight 52.2 kg, last menstrual period 04/03/2019.  Physical Exam  Nursing note and vitals reviewed. Constitutional: She is oriented to person, place, and time. She appears well-developed and well-nourished.  Cardiovascular: Normal rate and normal heart sounds.  Respiratory: Effort normal and breath sounds normal.  GI: Soft. Bowel sounds are normal. She exhibits no distension. There is no abdominal tenderness. There is no rebound and no guarding.  Neurological: She is alert and oriented to person, place, and time.  Skin: Skin is warm and dry.  Psychiatric: She has a normal mood and affect. Her behavior is normal. Judgment and thought content normal.    MAU Course/MDM  Procedures  Patient Vitals for the past 24 hrs:  BP Temp Pulse Resp Height Weight  05/12/19 2101 111/69 -- 77 -- -- --  05/12/19 2042 118/85 99 F (37.2 C) 90 18 5\' 3"  (1.6 m) 52.2 kg   OB Transvaginal  Result Date: 05/12/2019 CLINICAL DATA:  Evaluate for viable intrauterine pregnancy. EXAM: OBSTETRIC <14 WK 05/14/2019 AND TRANSVAGINAL OB US TECHNIQUE: Both transabdominal and transvaginal ultrasound examinations were performed for complete evaluation of the gestation as well as the maternal uterus, adnexal regions, and pelvic cul-de-sac. Transvaginal technique was performed to assess early pregnancy. COMPARISON:  May 06, 2019 FINDINGS: Intrauterine gestational sac: Single Yolk sac:  Visualized. Embryo:  Not Visualized. Cardiac Activity: Not Visualized. Heart Rate: N/A  bpm MSD: A 0.3 mm   5 w   4 d Subchorionic hemorrhage:  A small  subchorionic hemorrhage is noted. Maternal uterus/adnexae: The bilateral ovaries are normal in appearance. There is a small amount of pelvic free fluid. IMPRESSION: 1. Single intrauterine gestational sac and yolk sac without visualization of a fetal pole. This may be secondary to early intrauterine pregnancy. Correlation with follow-up pelvic ultrasound is recommended to confirm fetal viability. 2. Small subchorionic hemorrhage. 3. Small amount of pelvic free fluid. Electronically Signed   By: May 08, 2019 M.D.   On: 05/12/2019 21:54   Assessment and Plan  --24 y.o. G1P0 with confirmed IUP --Subchorionic hematoma, advised pelvic rest --Continue Tylenol, warm packs PRN --Given list of safe medications in pregnancy --Discharge home in stable condition with first trimester precautions  F/U: --[facility] New OB intake 05/29/2019  07/29/2019, CNM 05/12/2019, 11:10 PM

## 2019-05-29 ENCOUNTER — Other Ambulatory Visit: Payer: Self-pay

## 2019-05-29 ENCOUNTER — Other Ambulatory Visit (HOSPITAL_COMMUNITY)
Admission: RE | Admit: 2019-05-29 | Discharge: 2019-05-29 | Disposition: A | Discharge status: Home / Self Care | Payer: Medicaid Other | Source: Ambulatory Visit | Attending: Women's Health | Admitting: Women's Health

## 2019-05-29 ENCOUNTER — Ambulatory Visit (INDEPENDENT_AMBULATORY_CARE_PROVIDER_SITE_OTHER): Payer: Medicaid Other | Admitting: *Deleted

## 2019-05-29 VITALS — BP 109/69 | HR 107 | Temp 98.0°F | Wt 114.0 lb

## 2019-05-29 DIAGNOSIS — Z3400 Encounter for supervision of normal first pregnancy, unspecified trimester: Secondary | ICD-10-CM | POA: Diagnosis present

## 2019-05-29 DIAGNOSIS — Z3490 Encounter for supervision of normal pregnancy, unspecified, unspecified trimester: Secondary | ICD-10-CM | POA: Insufficient documentation

## 2019-05-29 DIAGNOSIS — Z3A12 12 weeks gestation of pregnancy: Secondary | ICD-10-CM

## 2019-05-29 DIAGNOSIS — Z3401 Encounter for supervision of normal first pregnancy, first trimester: Secondary | ICD-10-CM | POA: Diagnosis not present

## 2019-05-29 NOTE — Progress Notes (Signed)
Bedside U/S shows single IUP with FHT of 153 BPM and CRL measures 10.28mm  GA is [redacted]w[redacted]d.  PNL drawn today.  Pt will do Panorama @ 12 weeks.  Anatomy U/S order placed.  She will need pap at her next visit

## 2019-05-30 LAB — URINE CYTOLOGY ANCILLARY ONLY
Chlamydia: NEGATIVE
Comment: NEGATIVE
Comment: NORMAL
Neisseria Gonorrhea: NEGATIVE

## 2019-05-31 LAB — URINE CULTURE, OB REFLEX

## 2019-05-31 LAB — CULTURE, OB URINE

## 2019-06-01 ENCOUNTER — Other Ambulatory Visit: Payer: Self-pay | Admitting: Family Medicine

## 2019-06-01 LAB — OBSTETRIC PANEL
Absolute Monocytes: 689 cells/uL (ref 200–950)
Antibody Screen: NOT DETECTED
Basophils Absolute: 51 cells/uL (ref 0–200)
Basophils Relative: 0.6 %
Eosinophils Absolute: 77 cells/uL (ref 15–500)
Eosinophils Relative: 0.9 %
HCT: 42 % (ref 35.0–45.0)
Hemoglobin: 13.7 g/dL (ref 11.7–15.5)
Hepatitis B Surface Ag: NONREACTIVE
Lymphs Abs: 1428 cells/uL (ref 850–3900)
MCH: 28.4 pg (ref 27.0–33.0)
MCHC: 32.6 g/dL (ref 32.0–36.0)
MCV: 87 fL (ref 80.0–100.0)
MPV: 9.6 fL (ref 7.5–12.5)
Monocytes Relative: 8.1 %
Neutro Abs: 6256 cells/uL (ref 1500–7800)
Neutrophils Relative %: 73.6 %
Platelets: 305 10*3/uL (ref 140–400)
RBC: 4.83 10*6/uL (ref 3.80–5.10)
RDW: 13.7 % (ref 11.0–15.0)
RPR Ser Ql: NONREACTIVE
Rubella: 8.55 Index
Total Lymphocyte: 16.8 %
WBC: 8.5 10*3/uL (ref 3.8–10.8)

## 2019-06-01 LAB — HEMOGLOBINOPATHY EVALUATION
Fetal Hemoglobin Testing: <1 % (ref 0.0–1.9)
HCT: 41.2 % (ref 35.0–45.0)
Hemoglobin A2 - HGBRFX: 2.8 % (ref 2.2–3.2)
Hemoglobin: 13.6 g/dL (ref 11.7–15.5)
Hgb A: 97.2 % (ref >96.0)
MCH: 28.6 pg (ref 27.0–33.0)
MCV: 86.6 fL (ref 80.0–100.0)
RBC: 4.76 10*6/uL (ref 3.80–5.10)
RDW: 14 % (ref 11.0–15.0)

## 2019-06-01 LAB — HEPATITIS C ANTIBODY
Hepatitis C Ab: NONREACTIVE
SIGNAL TO CUT-OFF: 0 (ref <1.00)

## 2019-06-01 LAB — HIV ANTIBODY (ROUTINE TESTING W REFLEX): HIV 1&2 Ab, 4th Generation: NONREACTIVE

## 2019-06-01 MED ORDER — PROMETHAZINE HCL 25 MG PO TABS: 25.0000 mg | ORAL_TABLET | Freq: Four times a day (QID) | ORAL | 2 refills | Status: DC | PRN

## 2019-06-12 ENCOUNTER — Encounter: Payer: Medicaid Other | Admitting: Certified Nurse Midwife

## 2019-11-01 DIAGNOSIS — O24410 Gestational diabetes mellitus in pregnancy, diet controlled: Secondary | ICD-10-CM | POA: Insufficient documentation

## 2021-04-02 IMAGING — US US OB < 14 WEEKS - US OB TV
1 series · 15 of 28 positions shown · non-contrast
Comparison: None.

CLINICAL DATA: Abdominal pain. Beta hCG pending. Four weeks 5 days
by last menstrual.

EXAM:
OBSTETRIC <14 WK US AND TRANSVAGINAL OB US
TECHNIQUE: Both transabdominal and transvaginal ultrasound examinations were
performed for complete evaluation of the gestation as well as the
maternal uterus, adnexal regions, and pelvic cul-de-sac.
Transvaginal technique was performed to assess early pregnancy.

[Series 1: us ob < 14 weeks - us ob tv · 15 of 40 slices shown]
[im 1/40]
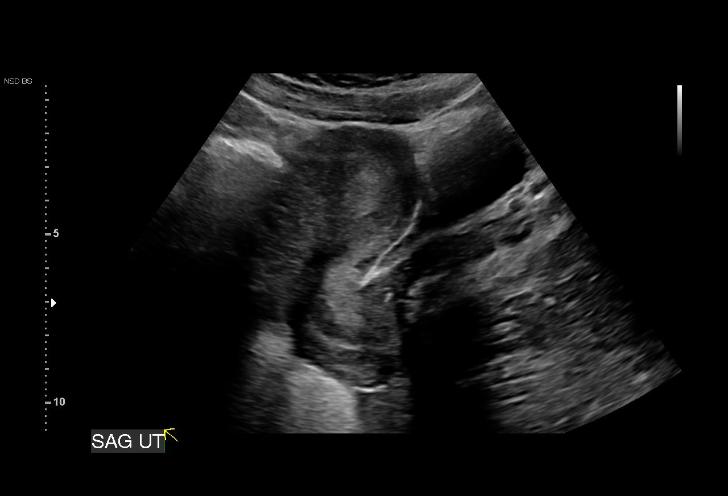
[im 3/40]
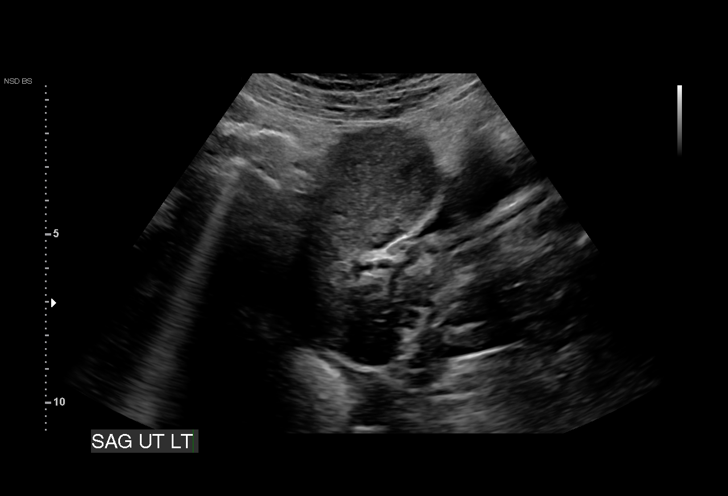
[im 6/40]
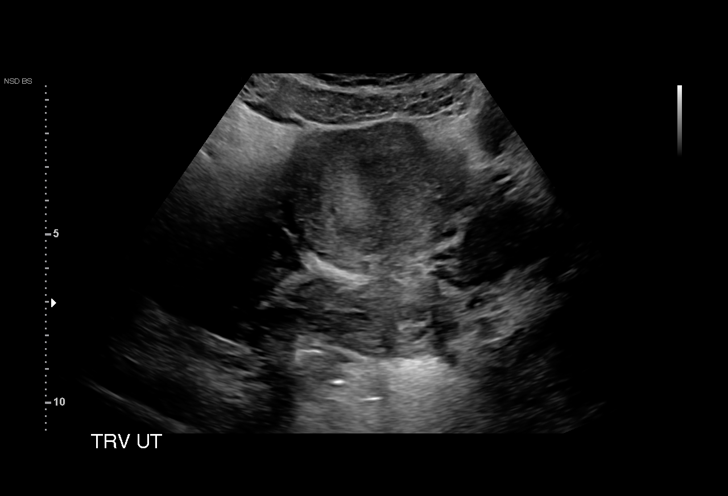
[im 9/40]
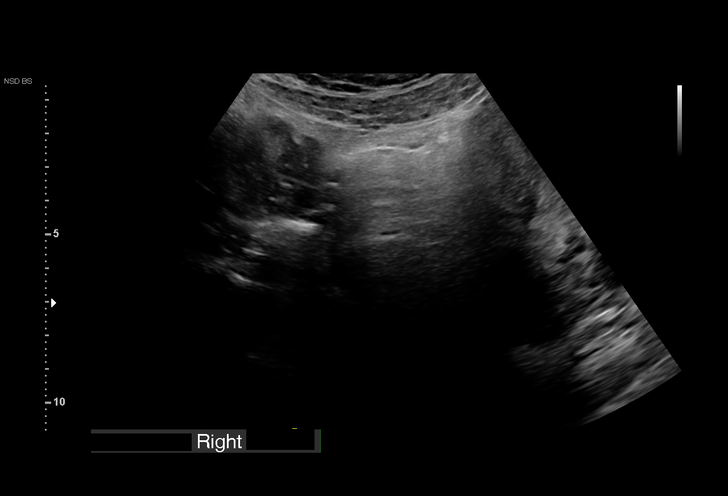
[im 12/40]
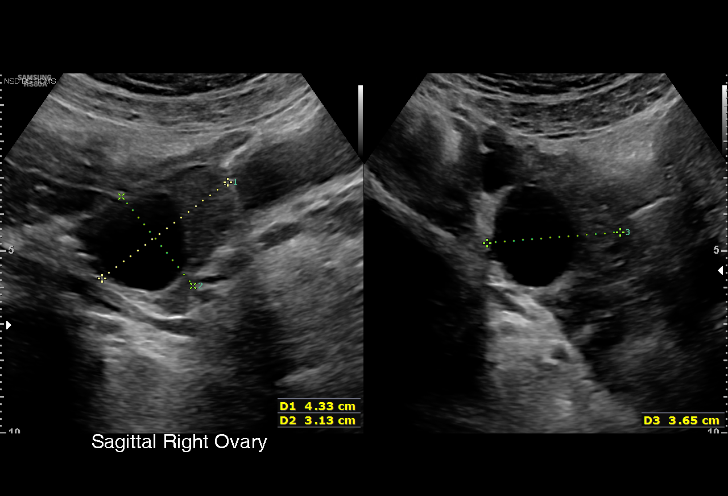
[im 15/40]
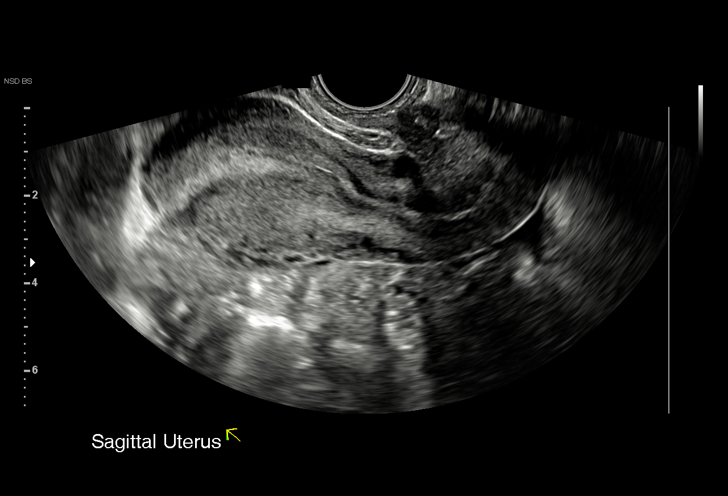
[im 18/40]
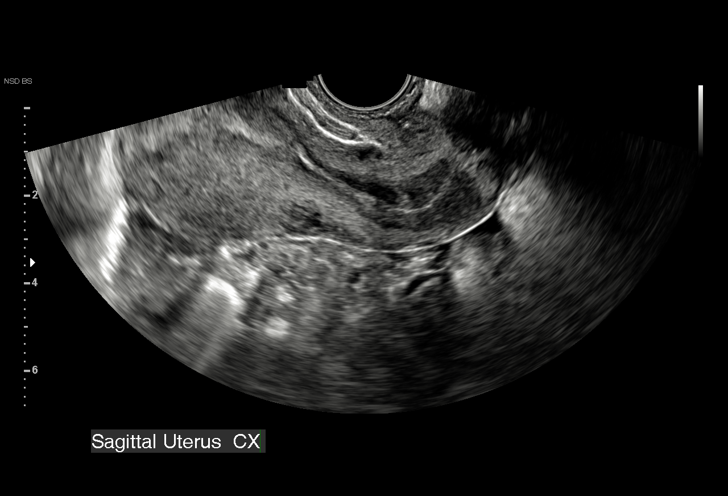
[im 21/40]
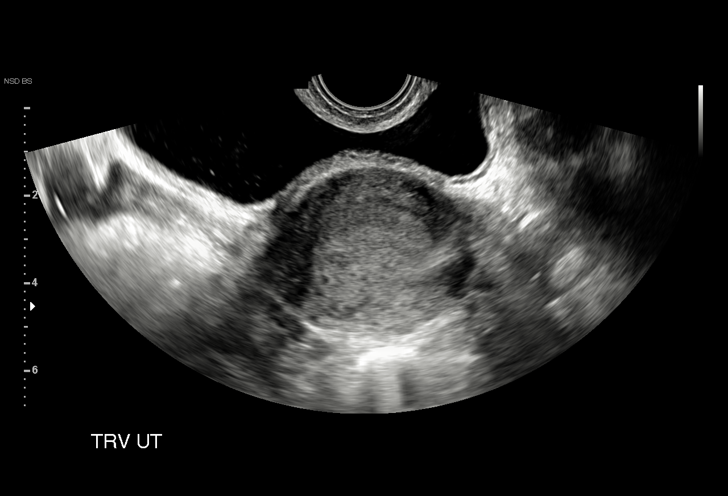
[im 22/40]
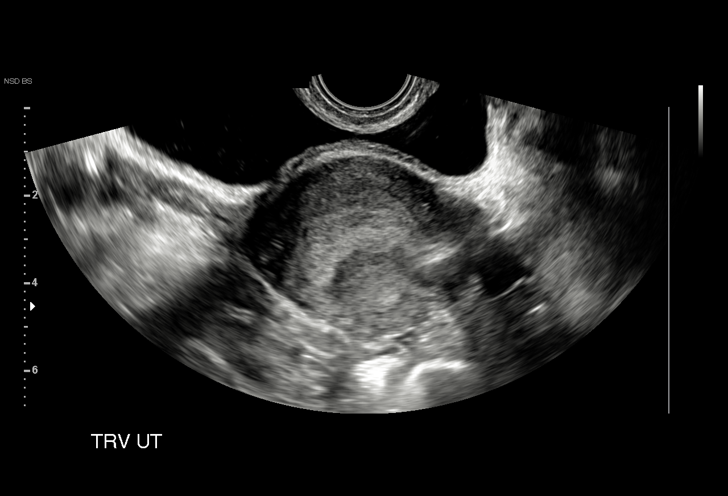
[im 25/40]
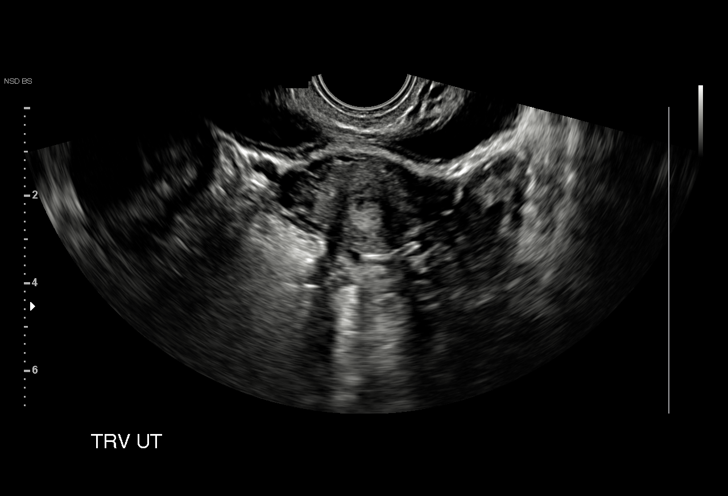
[im 28/40]
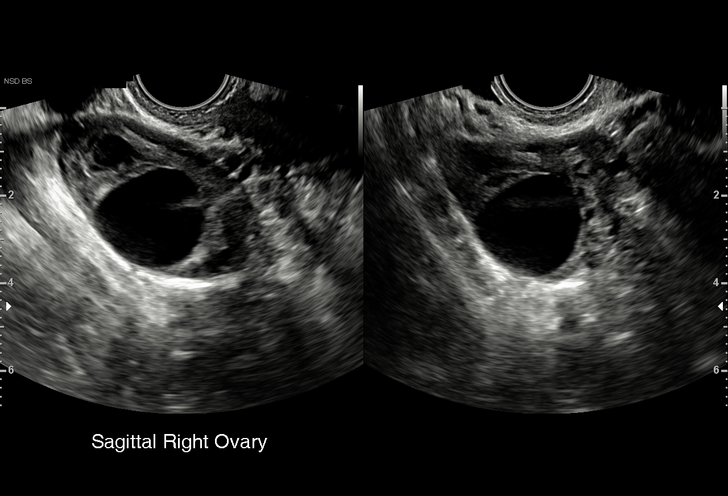
[im 31/40]
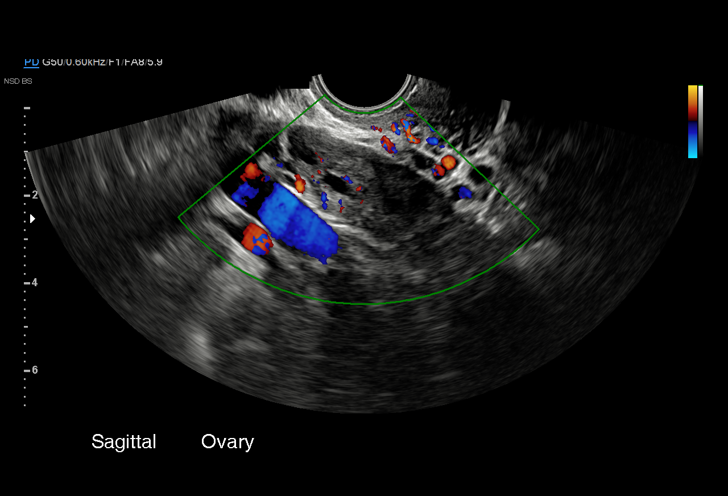
[im 34/40]
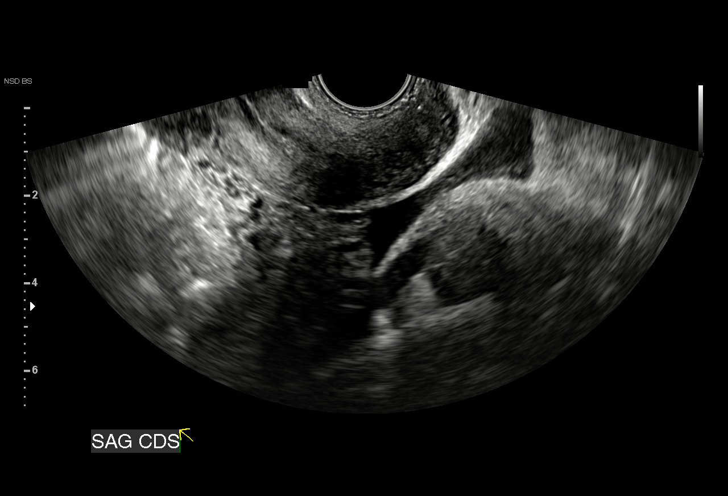
[im 37/40]
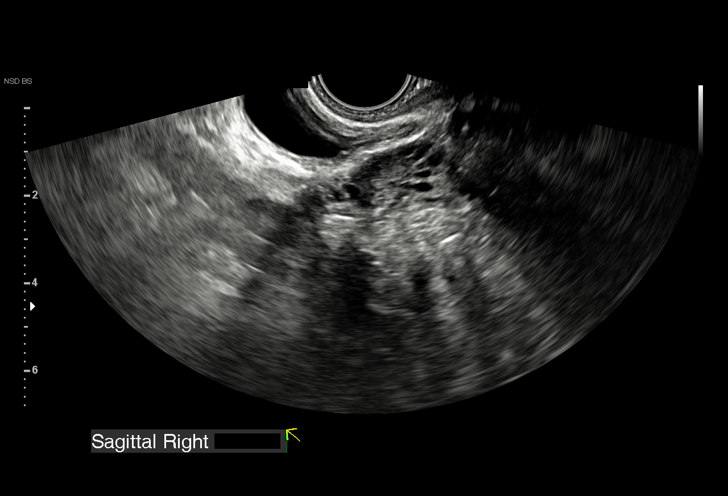
[im 40/40]
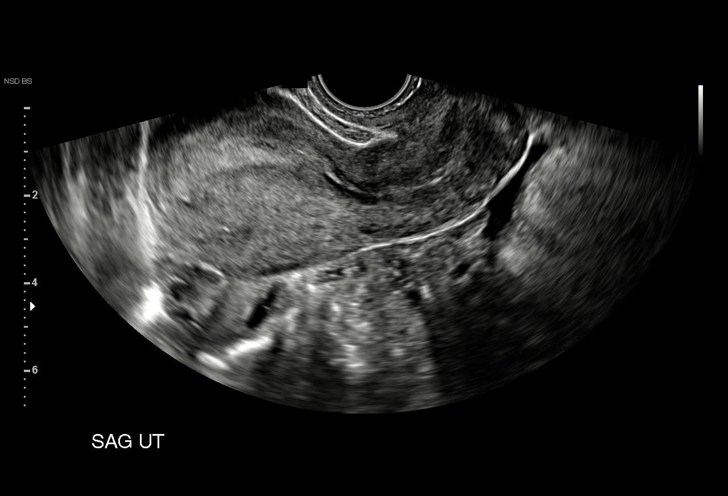

[15 of 28 positions shown; findings below may reference images not displayed]

FINDINGS: Intrauterine gestational sac: Absent

Maternal uterus/adnexae: Normal left ovary. A right ovarian corpus
luteal cyst measures on the order of 3.0 cm.

Trace free pelvic fluid is likely physiologic.
IMPRESSION: Absent intrauterine gestational sac. Given lack of beta HCG and
early gestational age by last menstrual, this most likely represents
an early intrauterine pregnancy. Missed abortion or otherwise occult
ectopic cannot be excluded.

Right ovarian corpus luteal cyst.

## 2021-04-08 IMAGING — US US OB TRANSVAGINAL
1 series · 15 of 27 positions shown · non-contrast
Comparison: May 06, 2019

CLINICAL DATA: Evaluate for viable intrauterine pregnancy.

EXAM:
OBSTETRIC <14 WK US AND TRANSVAGINAL OB US
TECHNIQUE: Both transabdominal and transvaginal ultrasound examinations were
performed for complete evaluation of the gestation as well as the
maternal uterus, adnexal regions, and pelvic cul-de-sac.
Transvaginal technique was performed to assess early pregnancy.

[Series 1: us ob transvaginal · 27 acquisitions, 15 frames shown]
[im 1/27]
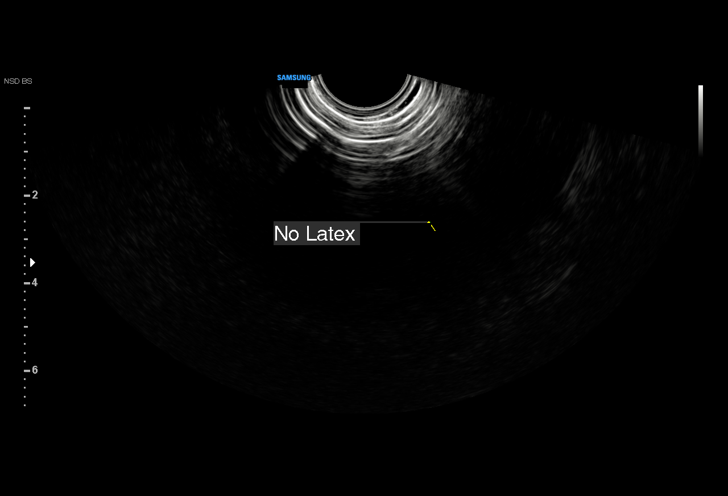
[im 3/27]
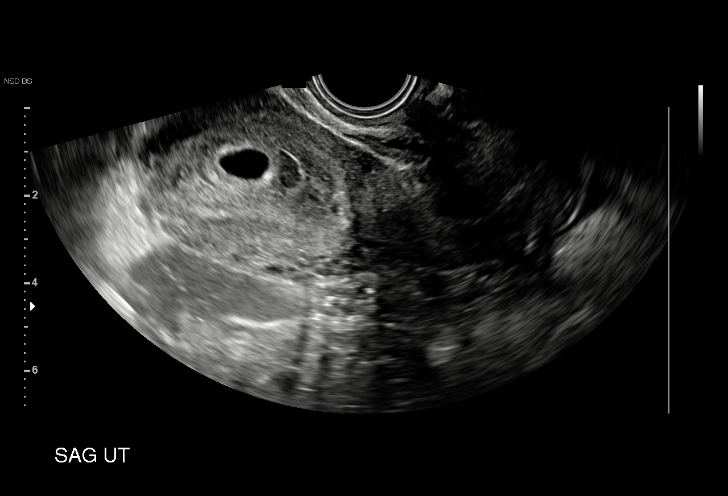
[im 5/27]
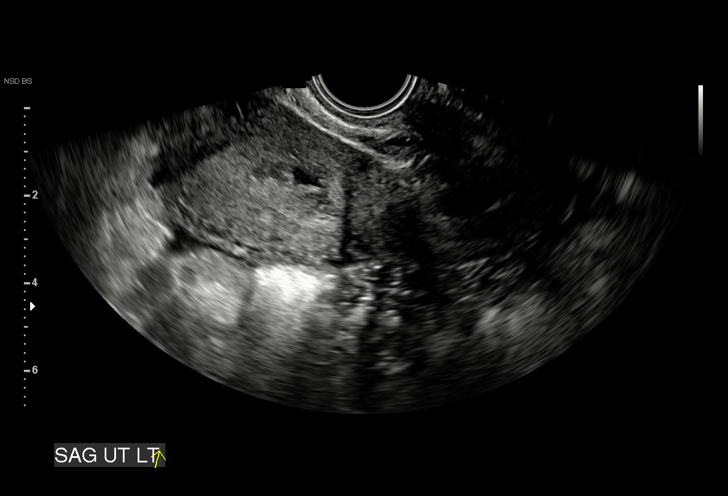
[im 7/27]
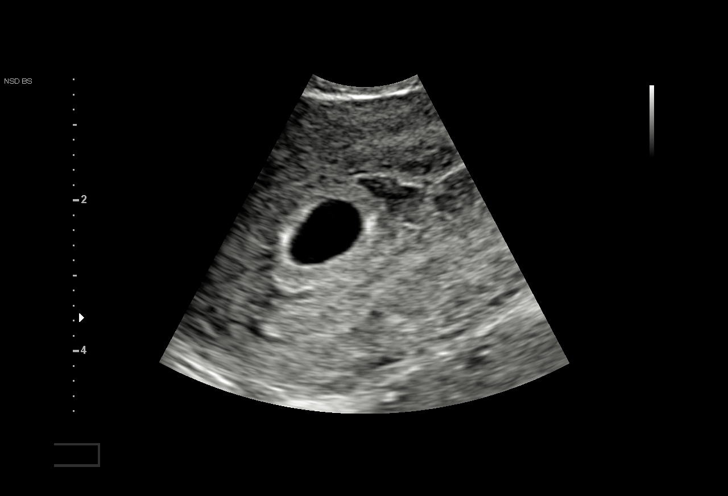
[im 9/27]
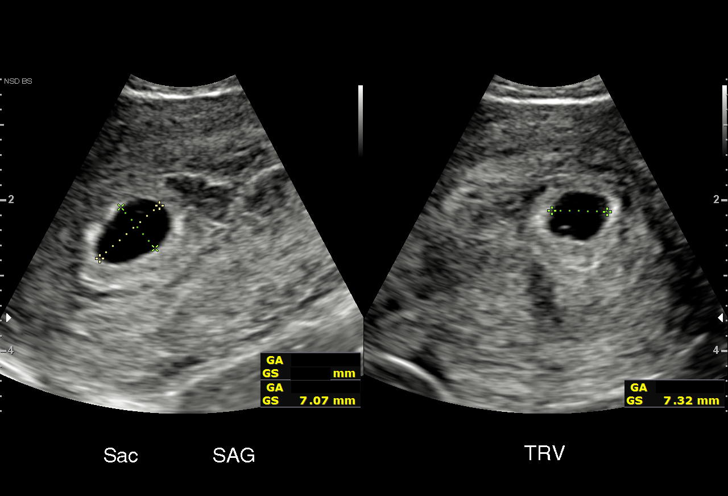
[im 10/27]
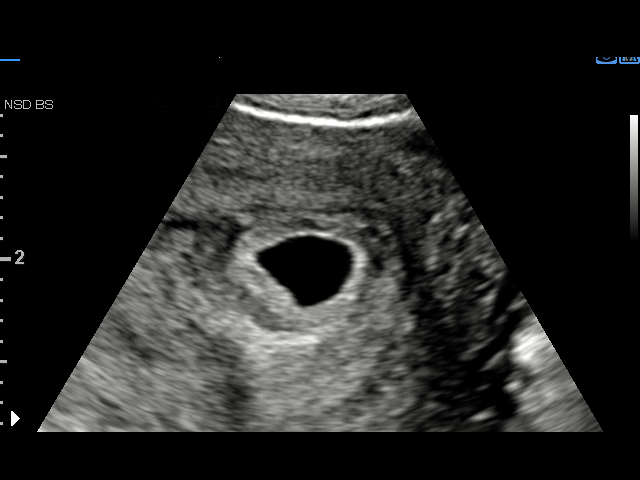
[im 12/27]
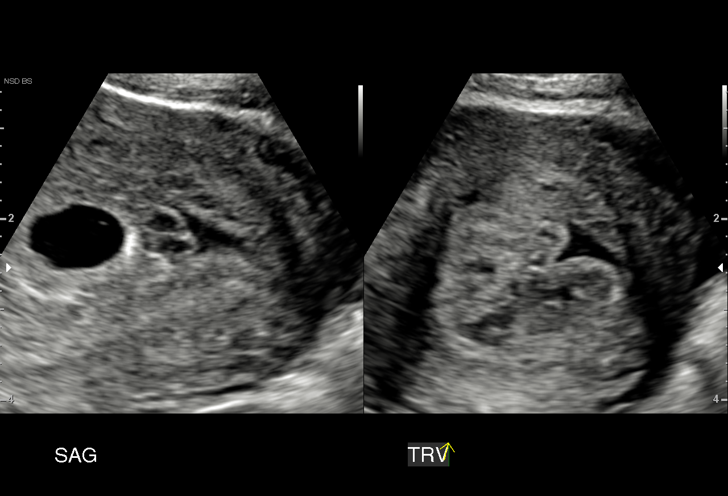
[im 14/27]
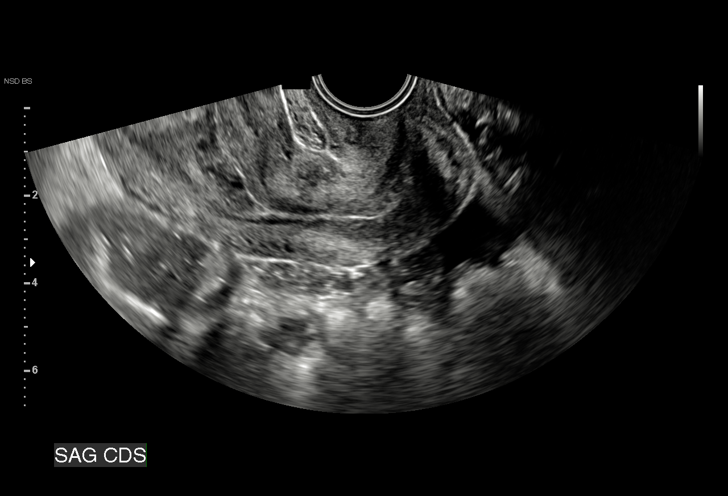
[im 16/27]
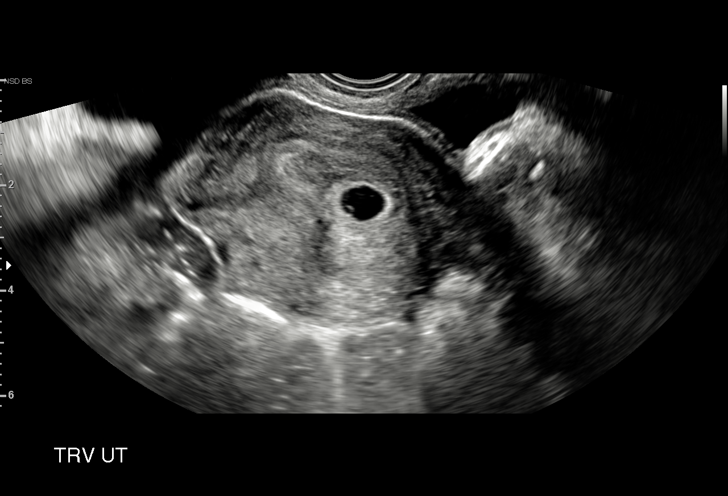
[im 18/27]
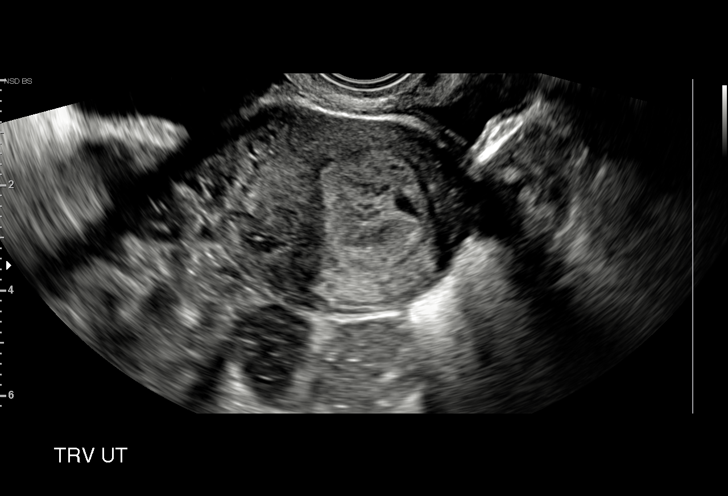
[im 19/27]
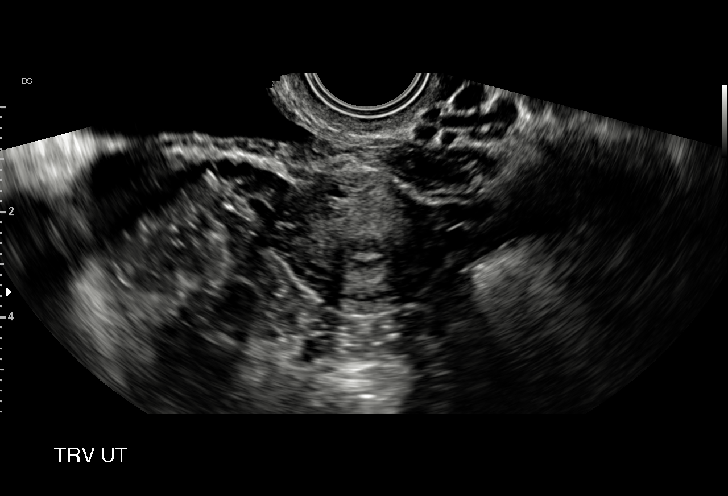
[im 21/27]
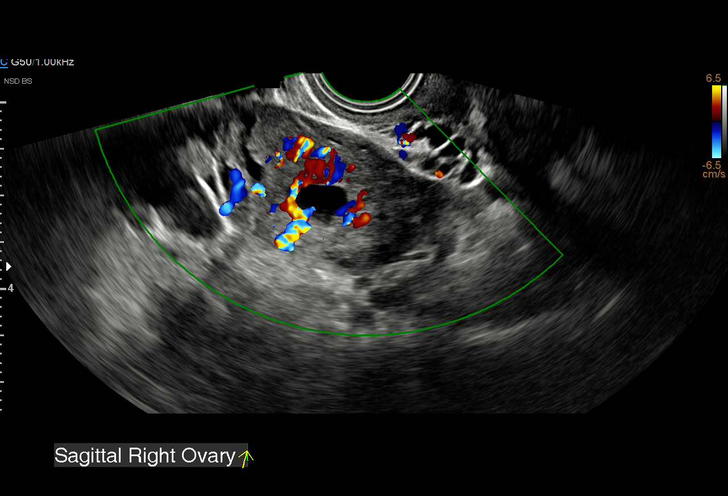
[im 23/27]
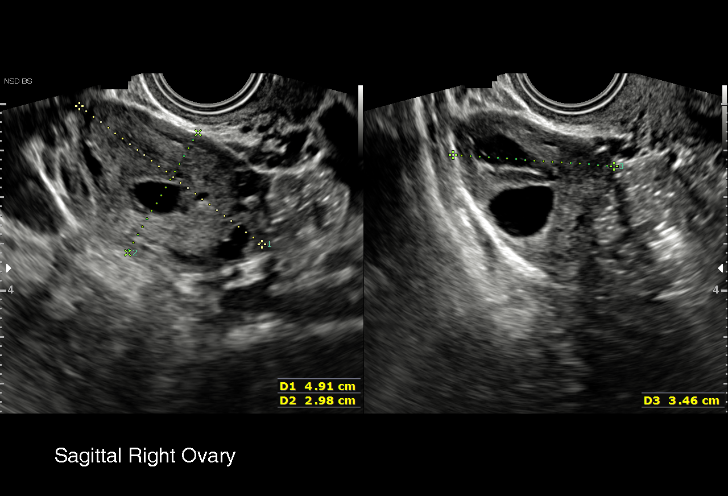
[im 25/27]
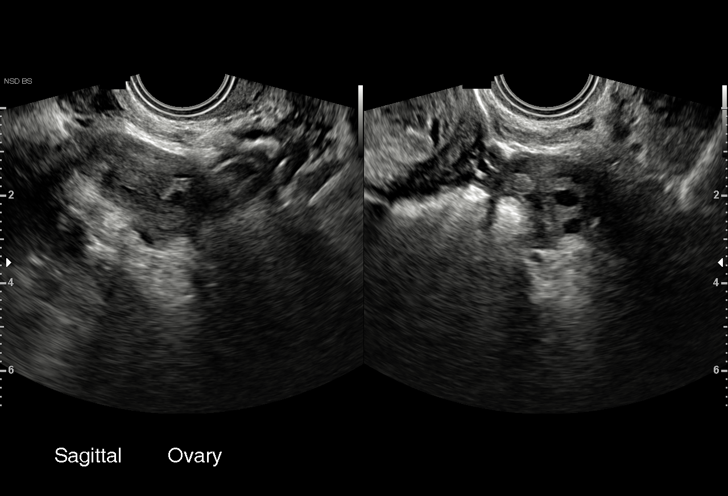
[im 27/27]
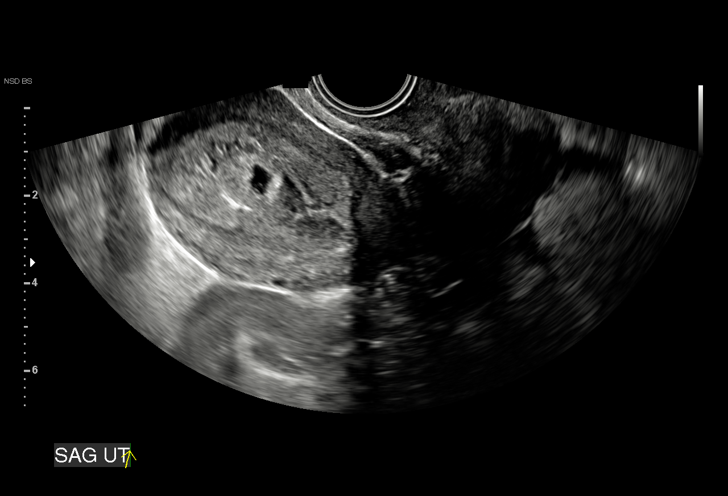

[15 of 27 positions shown; findings below may reference images not displayed]

FINDINGS: Intrauterine gestational sac: Single

Yolk sac:  Visualized.

Embryo:  Not Visualized.

Cardiac Activity: Not Visualized.

Heart Rate: N/A  bpm

MSD: A 0.3 mm   5 w   4 d

Subchorionic hemorrhage:  A small subchorionic hemorrhage is noted.

Maternal uterus/adnexae: The bilateral ovaries are normal in
appearance.

There is a small amount of pelvic free fluid.
IMPRESSION: 1. Single intrauterine gestational sac and yolk sac without
visualization of a fetal pole. This may be secondary to early
intrauterine pregnancy. Correlation with follow-up pelvic ultrasound
is recommended to confirm fetal viability.
2. Small subchorionic hemorrhage.
3. Small amount of pelvic free fluid.

## 2022-04-22 DIAGNOSIS — L700 Acne vulgaris: Secondary | ICD-10-CM | POA: Diagnosis not present

## 2022-05-25 DIAGNOSIS — L700 Acne vulgaris: Secondary | ICD-10-CM | POA: Diagnosis not present

## 2022-07-13 ENCOUNTER — Ambulatory Visit: Payer: Medicaid Other | Admitting: Family Medicine

## 2022-10-13 ENCOUNTER — Telehealth: Payer: Commercial Managed Care - PPO | Admitting: Physician Assistant

## 2022-10-13 ENCOUNTER — Encounter (HOSPITAL_COMMUNITY): Payer: Self-pay | Admitting: Emergency Medicine

## 2022-10-13 ENCOUNTER — Ambulatory Visit (HOSPITAL_COMMUNITY)
Admission: EM | Admit: 2022-10-13 | Discharge: 2022-10-13 | Disposition: A | Discharge status: Home / Self Care | Payer: Commercial Managed Care - PPO | Attending: Emergency Medicine | Admitting: Emergency Medicine

## 2022-10-13 DIAGNOSIS — N920 Excessive and frequent menstruation with regular cycle: Secondary | ICD-10-CM

## 2022-10-13 DIAGNOSIS — Z3202 Encounter for pregnancy test, result negative: Secondary | ICD-10-CM | POA: Diagnosis not present

## 2022-10-13 DIAGNOSIS — N939 Abnormal uterine and vaginal bleeding, unspecified: Secondary | ICD-10-CM | POA: Diagnosis not present

## 2022-10-13 LAB — CBC WITH DIFFERENTIAL/PLATELET
Abs Immature Granulocytes: 0.01 10*3/uL (ref 0.00–0.07)
Basophils Absolute: 0 10*3/uL (ref 0.0–0.1)
Basophils Relative: 0 %
Eosinophils Absolute: 0.1 10*3/uL (ref 0.0–0.5)
Eosinophils Relative: 2 %
HCT: 40 % (ref 36.0–46.0)
Hemoglobin: 13.6 g/dL (ref 12.0–15.0)
Immature Granulocytes: 0 %
Lymphocytes Relative: 26 %
Lymphs Abs: 1.4 10*3/uL (ref 0.7–4.0)
MCH: 29.6 pg (ref 26.0–34.0)
MCHC: 34 g/dL (ref 30.0–36.0)
MCV: 87 fL (ref 80.0–100.0)
Monocytes Absolute: 0.4 10*3/uL (ref 0.1–1.0)
Monocytes Relative: 8 %
Neutro Abs: 3.3 10*3/uL (ref 1.7–7.7)
Neutrophils Relative %: 64 %
Platelets: 293 10*3/uL (ref 150–400)
RBC: 4.6 MIL/uL (ref 3.87–5.11)
RDW: 11.9 % (ref 11.5–15.5)
WBC: 5.2 10*3/uL (ref 4.0–10.5)
nRBC: 0 % (ref 0.0–0.2)

## 2022-10-13 LAB — COMPREHENSIVE METABOLIC PANEL
ALT: 14 U/L (ref 0–44)
AST: 17 U/L (ref 15–41)
Albumin: 3.9 g/dL (ref 3.5–5.0)
Alkaline Phosphatase: 39 U/L (ref 38–126)
Anion gap: 10 (ref 5–15)
BUN: <5 mg/dL — ABNORMAL LOW (ref 6–20)
CO2: 22 mmol/L (ref 22–32)
Calcium: 9.3 mg/dL (ref 8.9–10.3)
Chloride: 105 mmol/L (ref 98–111)
Creatinine, Ser: 0.66 mg/dL (ref 0.44–1.00)
GFR, Estimated: >60 mL/min (ref >60)
Glucose, Bld: 79 mg/dL (ref 70–99)
Potassium: 4.3 mmol/L (ref 3.5–5.1)
Sodium: 137 mmol/L (ref 135–145)
Total Bilirubin: 1.1 mg/dL (ref 0.3–1.2)
Total Protein: 6.8 g/dL (ref 6.5–8.1)

## 2022-10-13 LAB — POCT URINALYSIS DIP (MANUAL ENTRY)
Bilirubin, UA: NEGATIVE
Glucose, UA: NEGATIVE mg/dL
Ketones, POC UA: NEGATIVE mg/dL
Leukocytes, UA: NEGATIVE
Nitrite, UA: NEGATIVE
Protein Ur, POC: NEGATIVE mg/dL
Spec Grav, UA: 1.015 (ref 1.010–1.025)
Urobilinogen, UA: 0.2 E.U./dL
pH, UA: 7 (ref 5.0–8.0)

## 2022-10-13 LAB — POCT URINE PREGNANCY: Preg Test, Ur: NEGATIVE

## 2022-10-13 NOTE — Patient Instructions (Addendum)
Diana Mejia, thank you for joining [name], PA-C for today's virtual visit.  While this provider is not your primary care provider (PCP), if your PCP is located in our provider database this encounter information will be shared with them immediately following your visit.   A [facility] MyChart account gives you access to today's visit and all your visits, tests, and labs performed at [facility] " click here if you don't have a [facility] MyChart account or go to [email or web address]  Consent: (Patient) Diana Mejia provided verbal consent for this virtual visit at the beginning of the encounter.  Current Medications:  Current Outpatient Medications:    calcium carbonate (TUMS - DOSED IN MG ELEMENTAL CALCIUM) 500 MG chewable tablet, Chew 1 tablet by mouth daily., Disp: , Rfl:    Prenatal Vit-Fe Fumarate-FA (MULTIVITAMIN-PRENATAL) 27-0.8 MG TABS tablet, Take 1 tablet by mouth daily at 12 noon., Disp: , Rfl:    promethazine (PHENERGAN) 25 MG tablet, Take 1 tablet (25 mg total) by mouth every 6 (six) hours as needed for nausea., Disp: 42 tablet, Rfl: 2   Medications ordered in this encounter:  No orders of the defined types were placed in this encounter.    *If you need refills on other medications prior to your next appointment, please contact your pharmacy*  Follow-Up: Call back or seek an in-person evaluation if the symptoms worsen or if the condition fails to improve as anticipated.  [facility] [phone]  Other Instructions   If you have been instructed to have an in-person evaluation today at a local Urgent Care facility, please use the link below. It will take you to a list of all of our available [facility], including address, phone number and hours of operation. Please do not delay care.  [facility]   [facility] for [facility] at [facility]             [address] [place],  [place] [address] [phone] (*Take patients with no insurance)  *Center for [employer] at [employer] [address] [place],  [place]  [address] [phone] (*Take patients with no insurance)  Center for [employer] at [employer]                                                             [address], [place], [place], [address] [phone]  Center for [facility] at [facility] [address], [place], [place], [address] [phone]  Center for [facility] at [facility] [address], [place], [place], [address] [phone]  Center for [facility] at [facility]                                 [address] [place], [place], [place], [address] [phone]  Center for [facility] at [facility]  [address], [place], [place], [address] [phone]  Center for [facility] Healthcare at [facility] [address], [address], [place], [place], [address]                              [facility] [place] [address], [place], [place], [address] [phone]    If you or a family member do not have a primary care provider, use the link below to schedule a visit and establish care. When you choose a [facility] primary care physician or advanced practice provider, you gain a long-term partner in health. Find a Primary Care Provider  Learn more about [facility]'s in-office and virtual care options: [facility] - Get Care Now

## 2022-10-13 NOTE — ED Triage Notes (Signed)
Pt reports missed one birth control pill. Now having a 10 day menstrual cycle when normally they are 4 days. Pt reports heavy bleeding and clots. Pt was wearing pads and tampons but now wearing just pads, having to change every 2 hours.  Had cramping couple days ago. Today feeling dizzy.  Had a virtual visit this morning and told that needed to be seen.

## 2022-10-13 NOTE — ED Provider Notes (Signed)
[facility]    CSN: [id number] Arrival date & time: 10/13/22  0850      History   Chief Complaint Chief Complaint  Patient presents with   Vaginal Bleeding    HPI Diana Mejia is a 27 y.o. female.   Patient presents to clinic for concern over heavy vaginal bleeding.  She is on combined oral contraceptives and had a normal menstrual cycle from September 1 to 4, she did miss a pill either on the 14th or 15th and started to have vaginal bleeding again on the 16th until today, almost 10 days of prolonged heavy vaginal bleeding with clots.   She is sexually active.  Took a urine pregnancy test at home on Monday and this was negative.  Reports she is changing her pads every 2 hours.  She is having fatigue and dizziness that started yesterday with intermittent abdominal pain. Nausea. Endorses weakness today.  Denies any history of iron deficiency anemia or anemias.  No vomiting or diarrhea.     The history is provided by the patient and medical records.  Vaginal Bleeding Associated symptoms: abdominal pain and nausea     Past Medical History:  Diagnosis Date   Medical history non-contributory     Patient Active Problem List   Diagnosis Date Noted   Supervision of normal pregnancy 05/29/2019    Past Surgical History:  Procedure Laterality Date   WISDOM TOOTH EXTRACTION      OB History     Gravida  1   Para      Term      Preterm      AB      Living         SAB      IAB      Ectopic      Multiple      Live Births               Home Medications    Prior to Admission medications   Medication Sig Start Date End Date Taking? Authorizing Provider  calcium carbonate (TUMS - DOSED IN MG ELEMENTAL CALCIUM) 500 MG chewable tablet Chew 1 tablet by mouth daily.    [provider]  Prenatal Vit-Fe Fumarate-FA (MULTIVITAMIN-PRENATAL) 27-0.8 MG TABS tablet Take 1 tablet by mouth daily at 12 noon.    [provider]   promethazine (PHENERGAN) 25 MG tablet Take 1 tablet (25 mg total) by mouth every 6 (six) hours as needed for nausea. 06/01/19   Reva Bores, MD  TRI-ESTARYLLA 0.18/0.215/0.25 MG-35 MCG tablet Take 1 tablet by mouth daily. 12/14/21   [provider]    Family History Family History  Problem Relation Age of Onset   Anxiety disorder Mother    Anxiety disorder Sister    Heart disease Maternal Grandfather     Social History Social History   Tobacco Use   Smoking status: Never   Smokeless tobacco: Never  Vaping Use   Vaping status: Never Used  Substance Use Topics   Alcohol use: Not Currently    Comment: occasionally   Drug use: Never     Allergies   Penicillins   Review of Systems Review of Systems  Gastrointestinal:  Positive for abdominal pain and nausea.  Genitourinary:  Positive for vaginal bleeding.     Physical Exam Triage Vital Signs ED Triage Vitals  Encounter Vitals Group     BP 10/13/22 0911 116/80     Systolic BP Percentile --  Diastolic BP Percentile --      Pulse Rate 10/13/22 0911 74     Resp 10/13/22 0911 16     Temp 10/13/22 0911 98.6 F (37 C)     Temp Source 10/13/22 0911 Oral     SpO2 10/13/22 0911 98 %     Weight --      Height --      Head Circumference --      Peak Flow --      Pain Score 10/13/22 0908 0     Pain Loc --      Pain Education --      Exclude from Growth Chart --    No data found.  Updated Vital Signs BP 116/80 (BP Location: Left Arm)   Pulse 74   Temp 98.6 F (37 C) (Oral)   Resp 16   LMP 10/04/2022   SpO2 98%   Visual Acuity Right Eye Distance:   Left Eye Distance:   Bilateral Distance:    Right Eye Near:   Left Eye Near:    Bilateral Near:     Physical Exam Vitals and nursing note reviewed.  Constitutional:      Appearance: Normal appearance.  HENT:     Head: Normocephalic and atraumatic.     Right Ear: External ear normal.     Left Ear: External ear normal.     Nose: Nose normal.      Mouth/Throat:     Mouth: Mucous membranes are moist.  Cardiovascular:     Rate and Rhythm: Normal rate and regular rhythm.  Pulmonary:     Effort: Pulmonary effort is normal. No respiratory distress.  Abdominal:     General: Abdomen is flat. Bowel sounds are normal. There is no distension.     Palpations: Abdomen is soft. There is no mass.     Tenderness: There is abdominal tenderness.     Hernia: No hernia is present.  Musculoskeletal:        General: Normal range of motion.  Skin:    General: Skin is warm and dry.  Neurological:     General: No focal deficit present.     Mental Status: She is alert and oriented to person, place, and time.  Psychiatric:        Mood and Affect: Mood normal.        Behavior: Behavior normal. Behavior is cooperative.      UC Treatments / Results  Labs (all labs ordered are listed, but only abnormal results are displayed) Labs Reviewed  POCT URINALYSIS DIP (MANUAL ENTRY) - Abnormal; Notable for the following components:      Result Value   Clarity, UA cloudy (*)    Blood, UA large (*)    All other components within normal limits  COMPREHENSIVE METABOLIC PANEL  CBC WITH DIFFERENTIAL/PLATELET  POCT URINE PREGNANCY    EKG   Radiology No results found.  Procedures Procedures (including critical care time)  Medications Ordered in UC Medications - No data to display  Initial Impression / Assessment and Plan / UC Course  I have reviewed the triage vital signs and the nursing notes.  Pertinent labs & imaging results that were available during my care of the patient were reviewed by me and considered in my medical decision making (see chart for details).  Vitals and triage reviewed, patient is hemodynamically stable.  Patient having heavy menstrual bleeding with 2 withdrawal bleeds this month, suspect in relation to missed combined oral contraceptive  dose.  Reports heavy vaginal bleeding with dizziness and weakness, CBC and CMP  obtained.  No syncope, stable vitals.  Abdomen is soft with mild tenderness to bilateral lower quadrants, without rebound or guarding.  Negative urine pregnancy test, urinalysis without infection.  Advised to follow-up with an OB/GYN.  Emergency precautions given.  Will contact if labs require urgent follow-up.  Continue COC.  Plan of care, follow-up care return precautions given, no questions at this time.     Final Clinical Impressions(s) / UC Diagnoses   Final diagnoses:  Vaginal bleeding  Urine pregnancy test negative     Discharge Instructions      Your urine pregnancy was negative.  It is unclear why you are having heavy vaginal bleeding, I suspect it is partially due to your missed oral contraceptive.  We are checking basic labs and we will call you if anything requires urgent follow-up.  Please ensure you are staying well-hydrated with at least 64 ounces of water, and eating balanced diet.  Continue to take your pills normally, as prescribed.  Please follow-up with an OB/GYN, it is important to be established with them for routine visits and they can follow-up on this new vaginal bleeding.  Seek immediate care at the nearest emergency department if you develop loss of consciousness, worsening heavy vaginal bleeding, or any new concerning symptoms.      ED Prescriptions   None    PDMP not reviewed this encounter.   Gabriel Paulding, [place], [place] 10/13/22 1017

## 2022-10-13 NOTE — Progress Notes (Signed)
Virtual Visit Consent   Diana Mejia, you are scheduled for a virtual visit with a [facility] provider today. Just as with appointments in the office, your consent must be obtained to participate. Your consent will be active for this visit and any virtual visit you may have with one of our providers in the next 365 days. If you have a MyChart account, a copy of this consent can be sent to you electronically.  As this is a virtual visit, video technology does not allow for your provider to perform a traditional examination. This may limit your provider's ability to fully assess your condition. If your provider identifies any concerns that need to be evaluated in person or the need to arrange testing (such as labs, EKG, etc.), we will make arrangements to do so. Although advances in technology are sophisticated, we cannot ensure that it will always work on either your end or our end. If the connection with a video visit is poor, the visit may have to be switched to a telephone visit. With either a video or telephone visit, we are not always able to ensure that we have a secure connection.  By engaging in this virtual visit, you consent to the provision of healthcare and authorize for your insurance to be billed (if applicable) for the services provided during this visit. Depending on your insurance coverage, you may receive a charge related to this service.  I need to obtain your verbal consent now. Are you willing to proceed with your visit today? Diana Mejia has provided verbal consent on 10/13/2022 for a virtual visit (video or telephone). Diana Mejia, [place]  Date: 10/13/2022 8:19 AM  Virtual Visit via Video Note   I, Diana Mejia, connected with  Diana Mejia  ([id number], 1995/12/05) on 10/13/22 at  8:15 AM EDT by a video-enabled telemedicine application and verified that I am speaking with the correct person using two identifiers.  Location: Patient: Virtual Visit Location  Patient: Home Provider: Virtual Visit Location Provider: Home Office   I discussed the limitations of evaluation and management by telemedicine and the availability of in person appointments. The patient expressed understanding and agreed to proceed.    History of Present Illness: Diana Mejia is a 27 y.o. who identifies as a female who was assigned female at birth, and is being seen today for heavy menstrual bleeding. Patient notes she missed one dose of her OCP last Monday. Took it a few days later but started having substantial vaginal bleeding -- on day 10. Feels very tired and weak. Some clotting. Going through a tampon every 2 hours. Denies SOB or chest pain but substantial fatigue. Now having a migraine. Is sexually active and concerned for potential miscarriage.   HPI: HPI  Problems:  Patient Active Problem List   Diagnosis Date Noted   Supervision of normal pregnancy 05/29/2019    Allergies:  Allergies  Allergen Reactions   Penicillins Hives   Medications:  Current Outpatient Medications:    TRI-ESTARYLLA 0.18/0.215/0.25 MG-35 MCG tablet, Take 1 tablet by mouth daily., Disp: , Rfl:    calcium carbonate (TUMS - DOSED IN MG ELEMENTAL CALCIUM) 500 MG chewable tablet, Chew 1 tablet by mouth daily., Disp: , Rfl:    Prenatal Vit-Fe Fumarate-FA (MULTIVITAMIN-PRENATAL) 27-0.8 MG TABS tablet, Take 1 tablet by mouth daily at 12 noon., Disp: , Rfl:    promethazine (PHENERGAN) 25 MG tablet, Take 1 tablet (25 mg total) by mouth every 6 (six) hours as needed for nausea.,  Disp: 42 tablet, Rfl: 2  Observations/Objective: Patient is well-developed, well-nourished in no acute distress.  Resting comfortably at home.  Head is normocephalic, atraumatic.  No labored breathing.  Speech is clear and coherent with logical content.  Patient is alert and oriented at baseline.   Assessment and Plan: 1. Menorrhagia with regular cycle  Day 10. Substantial Concern for developing anemia. Needs in  person evaluation. Resources given so she can be seen this morning. ER precautions reviewed.  Follow Up Instructions: I discussed the assessment and treatment plan with the patient. The patient was provided an opportunity to ask questions and all were answered. The patient agreed with the plan and demonstrated an understanding of the instructions.  A copy of instructions were sent to the patient via MyChart unless otherwise noted below.   The patient was advised to call back or seek an in-person evaluation if the symptoms worsen or if the condition fails to improve as anticipated.  Time:  I spent 10 minutes with the patient via telehealth technology discussing the above problems/concerns.    Diana Climes, PA-C

## 2022-10-13 NOTE — Discharge Instructions (Addendum)
Your urine pregnancy was negative.  It is unclear why you are having heavy vaginal bleeding, I suspect it is partially due to your missed oral contraceptive.  We are checking basic labs and we will call you if anything requires urgent follow-up.  Please ensure you are staying well-hydrated with at least 64 ounces of water, and eating balanced diet.  Continue to take your pills normally, as prescribed.  Please follow-up with an OB/GYN, it is important to be established with them for routine visits and they can follow-up on this new vaginal bleeding.  Seek immediate care at the nearest emergency department if you develop loss of consciousness, worsening heavy vaginal bleeding, or any new concerning symptoms.

## 2022-10-13 NOTE — ED Notes (Signed)
Pt reports had normal cycle 9/1-9/4, then started having bleeding again on 9/16 to current.

## 2022-12-09 ENCOUNTER — Encounter: Payer: Self-pay | Admitting: Psychology

## 2022-12-09 ENCOUNTER — Ambulatory Visit: Payer: Commercial Managed Care - PPO | Admitting: Psychology

## 2022-12-09 DIAGNOSIS — F411 Generalized anxiety disorder: Secondary | ICD-10-CM

## 2022-12-09 NOTE — Progress Notes (Signed)
                Rudra Hobbins, PhD 
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
## 2022-12-20 ENCOUNTER — Ambulatory Visit (INDEPENDENT_AMBULATORY_CARE_PROVIDER_SITE_OTHER): Payer: Commercial Managed Care - PPO | Admitting: Psychology

## 2022-12-20 ENCOUNTER — Encounter: Payer: Self-pay | Admitting: Psychology

## 2022-12-20 DIAGNOSIS — F411 Generalized anxiety disorder: Secondary | ICD-10-CM
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
## 2022-12-20 NOTE — Progress Notes (Signed)
                Rudra Hobbins, PhD 

## 2022-12-21 ENCOUNTER — Ambulatory Visit: Payer: Commercial Managed Care - PPO | Admitting: Psychology

## 2022-12-21 ENCOUNTER — Encounter: Payer: Self-pay | Admitting: Psychology

## 2022-12-21 DIAGNOSIS — F411 Generalized anxiety disorder: Secondary | ICD-10-CM

## 2022-12-21 DIAGNOSIS — F603 Borderline personality disorder: Secondary | ICD-10-CM

## 2022-12-21 HISTORY — DX: Borderline personality disorder: F60.3
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
## 2022-12-22 ENCOUNTER — Other Ambulatory Visit: Payer: Commercial Managed Care - PPO | Admitting: Psychology

## 2022-12-23 ENCOUNTER — Encounter: Payer: Self-pay | Admitting: Psychology

## 2022-12-23 NOTE — Progress Notes (Signed)
Diana Mejia is a 27 y.o. female patient Report writing competed ( 3 hrs.).  Interactive feedback to be conducted next session. Report to be attached to the feedback progress note.  Patient/Guardian was advised Release of Information must be obtained prior to any record release in order to collaborate their care with an outside provider. Patient/Guardian was advised if they have not already done so to contact the registration department to sign all necessary forms in order for [place] to release information regarding their care.   Consent: Patient/Guardian gives verbal consent for treatment and assignment of benefits for services provided during this visit. Patient/Guardian expressed understanding and agreed to proceed.    Bryson Dames, PhD

## 2022-12-29 ENCOUNTER — Encounter: Payer: Self-pay | Admitting: Psychology

## 2022-12-29 ENCOUNTER — Ambulatory Visit: Payer: Commercial Managed Care - PPO | Admitting: Psychology

## 2022-12-29 DIAGNOSIS — F603 Borderline personality disorder: Secondary | ICD-10-CM

## 2022-12-29 DIAGNOSIS — F411 Generalized anxiety disorder: Secondary | ICD-10-CM | POA: Diagnosis not present

## 2022-12-29 DIAGNOSIS — F810 Specific reading disorder: Secondary | ICD-10-CM
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
## 2022-12-29 NOTE — Progress Notes (Signed)
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 by increasing exercise, improving sleep, eating a healthy diet, and managing depression/stress.  Consult with a physician regarding any changes to physical regimen.   Educational accommodations are recommended for Ms. 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  These include extra time for completing tests and assignments, along with assistance with reading test questions and instructions to ensure accuracy as needed.   The use of a digital pen can help helpful with organizing note taking, while reading memory can be enhanced by reading passages out loud or highlighting/underlying key words and passages while reading.     Salvatore Decent Sherrill Mckamie, Ph.D. Licensed Psychologist - [facility] [place] Licensed psychologist [phone]       Strategies for Patients with Executive Dysfunction *Remember that ED is not a knowledge problem, it is a performance problem.  In many situations, we would be helping to try to set up the environment to give them reminders  of when to use various strategies. This could be things like:  Setting a timer to help track the passing  of time closer and breaking large tasks into more manageable chunks.  Using calendars, reminders, sticky notes, etc. to assist in reducing forgetfulness.  Setting needed items out or packing them in the car the day prior to when they will be needed. Helping them to create reasonably long to-do lists and storing them where easily accessible, and making extra copies (if physical) in case they are misplaced.  Finding an organization system that improves organization but is maintainable (e.g., a box where items not relevant to the current task go, color coded file system, or a Psychiatrist). Recording themselves or having a trusted other person give cues to them in situations in which they perceive struggles as occurring to assist in building self-awareness.  The 4Rs: Read just one paragraph, recite out loud in a soft voice or whisper what was important in that material, write that material down in a notebook, then review what you just wrote.  Work on tasks with others that are generally organized to assist in sustaining attention and being more publicly accountable than doing the work solo.  Proper diet and exercise can improve executive functioning abilities.  Having a trusted other assist in managing finances, making a monthly budget sheet of all monthly expenses, setup a system that automatically put a percentage of earnings into a retirement and/or savings account, do not go to a store if there is nothing you NEED to buy, if using credit cards - put a "For Emergency Use Only" sticker on it," and avoid casinos.           Resources for Patients with Executive Dysfunction Applications: RescueTime. Tracks your activities on phone and/or computer to determine how productive you have been, and what distracted you. Free two week trial. Focus@Will . Uses engineered audio that may reduce  distractions and assist with focus. Free 15-day trial. Freedom. Allows you to highlight days and times you want to block yourself from certain sites or apps. Free trial. Vesta Mixer.  Allows you to input your bank accounts and creates a visual layout of various information about your financial goals, budget management, alerts, etc. May offer a free trial. Boomerang. Gives you the option to schedule times an email is sent as well as to see if others have received or opened your email. 10 messages free per month and a free trial of premium version. IFTTT. Uses "channels" to create various actions (e.g., if you are mentioned in an email to highlight it in your inbox and if you miss a call to add it to a to-do list). Free and premium versions. Unroll.me. Cleans up your email by unsubscribing from what you do not want to receive while still getting everything you do. Free. Finish. Allows you to divide two-list tasks into short-term, mid-term, and long-term as well as how much time is left for a task. Focus mode hides non-priority tasks. Autosilent. Turns your phone ringer on and off based on specified calendars, geo-fences, timers, etc. $3.99. Freakyalarm. Makes you solve math problems to disable an alarm. $1.99. Wake N Shake. Makes you vigorously shake your phone to stop the alarm. $.99. Todoist. Allows you to add sub-tasks to tasks as well as includes email and Web plugins to make it work across system. Premium has location-based reminders, calendar sync, productive tracking, etc. Sleep Cycle. Utilizes your phone's motion sensors to pick up on movement while you are asleep. The alarm will wake you as early as 30 minutes before your alarm based on your lightest phase of sleep as  well as showing you how daily activities affect your sleep quality.             Higher education careers adviser dysfunction can significantly impact an individual's ability to function at home, at school,  at work, or in [employer]. Several different approaches to executive function intervention have been developed by neuropsychologists, rehabilitation specialists, and others that are aimed at helping individuals cope with executive dysfunction. One type of intervention involves the application of cognitive remediation techniques that typically emphasize repeated practice with tasks, such as memory and attention tasks, that are intended to improve the deficient skill.   Another type of intervention involves teaching compensatory strategies. These strategies are designed to circumvent rather than directly improve deficits and also have demonstrated effectiveness in a number of patient populations. Still others emphasize the interaction of the individual within the environment and how antecedent environmental modifications or accommodations can facilitate executive functions. It should be noted that these approaches to dealing with executive dysfunction need not be mutually exclusive and many intervention programs are characterized by a hybrid approach.   Compensatory strategies themselves can take several forms including using external aides (e.g., use of a notebook), learning cognitive strategies (e.g., verbalization), and making environmental modifications (e.g., keeping workspace clutter-free). Research has demonstrated that both healthy adults as well as individuals who have executive deficits commonly rely on external aids for executive and other cognitive processes. The probability of success with compensatory strategies can be enhanced by building on an individual's existing strategies, systematically training the new strategies, and tailoring the compensatory strategies to the individual's unique needs and environmental contexts. More frequent use of aides or strategies and the use of a greater variety of aides is helpful when it comes to memory, and this also may hold true for executive dysfunction.    Adherence to routines and resistance to change may reflect Ms. Stankowski's need for predictability in her environment. An essential tenet of intervention is to facilitate feelings of security by maintaining a set of basic routines, then adjusting routines slightly in a stepwise fashion. Larger steps may provoke resistance and distress.  An individual who has difficulties shifting set can often adjust to changes in schedule or routine with the use of visual organizers such as pictures, schedules, planners, and calendar boards. This will let Ms. Abdellatif know the order of activities for the day and can alert her to variations in the usual sequence of events before they occur.  Displaying a daily schedule and reviewing it at the outset of the day can help an adult like Ms. Buggs anticipate the sequence of events and can serve as a useful reminder of any changes in her daily routine. Any changes in scheduled activities, persons, or events can be placed on Ms. Uemura's schedule and called to her attention with as much advance notice as possible. This provides more time for her to adjust to the change.    An individual who has difficulties shifting attention often needs to focus on only one task at a time. Presenting one task at a time and limiting choices to only one or two may be helpful.  Ms. Rei might benefit from practicing her mental flexibility. Working with two or three familiar tasks and rotating them at regular intervals may help develop greater flexibility and help her become more accustomed to shifting.  Ms. Glowacki might benefit from external prompting to shift attention, behavior, or cognitive set from one activity or focus to the next. This may  include, for example, the use of a timer on a watch or other device.  Some individuals can benefit from set time limits for each task before a shift to the next task is required.  Ms. Hove might work on one activity or assignment for a set period then an  alternative activity for the next period. Use of a timer can facilitate Ms. Maxon's adjustment to change in activity.  Having regularly scheduled times during the day in which different tasks are carried out, separated by rest breaks or leisure activities, can facilitate shifting. For example, Ms. Cease might work on task A each morning and task B, each afternoon.  Sometimes working in small groups or being paired with another person can help individuals shift their focus or cognitive set. Others can model that it is time to change, cuing Ms. Beverely Pace by their behavior.  Individuals such as Ms. Romos have difficulty monitoring the impact of their behavior on others. Having a supportive partner/therapist/teacher provide her with subtle cues to help recognize the effects of her behavior may be helpful. Similarly, providing opportunities for self-monitoring in contexts where she can receive constructive feedback, such as group therapy or social skills training, may also prove beneficial.  Ms. Sigur may not be able to consider the impact of her behavior in the immediate situation. It may be helpful or necessary to discuss behavior removed from the situation.  It may be helpful to videotape Ms. Joy interacting socially and then review it together. This allows her to see herself from another's perspective.   A Social Support group may be a helpful venue to increase Ms. Baiz's awareness of the impact her behavior has on others. This can provide not only direct skill training but also an opportunity for helpful feedback from a counselor or peers in a safe setting.  Some individuals like Ms. Uffelman may benefit from receiving reading material related to the nature and causes of their cognitive and behavioral problems. The provision of articles or "fact sheets" can help improve awareness, increase responsiveness to more direct interventions, and facilitate understanding between individuals with cognitive deficits  and their caregivers, family members, and supervisors.                Bryson Dames, PhD

## 2022-12-29 NOTE — Progress Notes (Signed)
                Rudra Hobbins, PhD 

## 2023-02-03 ENCOUNTER — Ambulatory Visit: Payer: 59 | Admitting: Nurse Practitioner

## 2023-02-03 VITALS — Ht 63.0 in | Wt 107.0 lb

## 2023-02-03 DIAGNOSIS — Z136 Encounter for screening for cardiovascular disorders: Secondary | ICD-10-CM | POA: Diagnosis not present

## 2023-02-03 DIAGNOSIS — Z13220 Encounter for screening for lipoid disorders: Secondary | ICD-10-CM

## 2023-02-03 DIAGNOSIS — G43919 Migraine, unspecified, intractable, without status migrainosus: Secondary | ICD-10-CM | POA: Diagnosis not present

## 2023-02-03 DIAGNOSIS — Z309 Encounter for contraceptive management, unspecified: Secondary | ICD-10-CM | POA: Insufficient documentation

## 2023-02-03 DIAGNOSIS — Z8249 Family history of ischemic heart disease and other diseases of the circulatory system: Secondary | ICD-10-CM

## 2023-02-03 DIAGNOSIS — F411 Generalized anxiety disorder: Secondary | ICD-10-CM

## 2023-02-03 DIAGNOSIS — Z131 Encounter for screening for diabetes mellitus: Secondary | ICD-10-CM | POA: Diagnosis not present

## 2023-02-03 MED ORDER — BUPROPION HCL ER (XL) 150 MG PO TB24
150.0000 mg | ORAL_TABLET | Freq: Every day | ORAL | 1 refills | Status: AC
Start: 2023-02-03 — End: ?

## 2023-02-03 MED ORDER — SUMATRIPTAN SUCCINATE 50 MG PO TABS
50.0000 mg | ORAL_TABLET | Freq: Once | ORAL | 0 refills | Status: AC | PRN
Start: 2023-02-03 — End: ?

## 2023-02-03 MED ORDER — NORGESTIM-ETH ESTRAD TRIPHASIC 0.18/0.215/0.25 MG-35 MCG PO TABS
1.0000 | ORAL_TABLET | Freq: Every day | ORAL | 11 refills | Status: DC
Start: 2023-02-03 — End: 2023-12-02

## 2023-02-03 NOTE — Assessment & Plan Note (Signed)
Chronic, intermittent Will try sumatriptan for abortive therapy Patient to follow-up in 1 month to discuss how she tolerates the medication.

## 2023-02-03 NOTE — Patient Instructions (Signed)
Use a condom for back-up contraception for 1 week after starting your oral contraceptive pill  Call office for any suicidal or homicidal thoughts

## 2023-02-03 NOTE — Assessment & Plan Note (Signed)
Labs ordered, further recommendations may be made based upon these results. 

## 2023-02-03 NOTE — Assessment & Plan Note (Signed)
Chronic Treatment options discussed, per shared decision making we will trial Wellbutrin 150 mg tablet daily.  Patient notified of blackbox warning of suicidal ideation and to call office if this were to occur. Follow-up in 4 to 6 weeks.

## 2023-02-03 NOTE — Assessment & Plan Note (Addendum)
Point-of-care pregnancy test negative today. Will refill her oral contraceptive pill.  Patient encouraged to use backup contraception with condom use for 1 week. She was also advised that Wellbutrin and sumatriptan are not generally first line treatment options in pregnancy thus she should reach out before planning another pregnancy and if she has an unplanned pregnancy she should notify us to discuss medication adjustments.  She reports understanding.

## 2023-02-03 NOTE — Assessment & Plan Note (Signed)
Labs ordered, further recommendations may be made based upon his results. 

## 2023-02-03 NOTE — Progress Notes (Signed)
New Patient Office Visit  Subjective    Patient ID: Diana Mejia, female    DOB: [birth year]  Age: 28 y.o. MRN: [id number]  CC:  Chief Complaint  Patient presents with   Anxiety    HPI Diana Mejia presents to establish care Patient has establish care.  Her main concerns today include anxiety and migraines.  She reports that she is in counseling services, she is also a mom to a 8-year-old, and in school full-time.  She is experiencing significant anxiety which is affecting her mood.  Per her report she has been diagnosed with borderline personality disorder and is struggling with mood stability.  Denies any symptoms of mania.  No suicidal homicidal ideation.  She also mentions frequent migraines, recently they have been daily.  She is taking 800 mg of ibuprofen over-the-counter which does help with the migraine but only temporarily.  She is experiencing nausea associated with the migraine but no vomiting.  She uses oral contraceptive pill for contraception, ran out of this a couple days ago.  Last menstrual cycle started 01/26/2023 and ended 01/30/2023.  Would like to have oral contraceptive pill refilled.  Denies any personal history of blood clot non-smoker.  Outpatient Encounter Medications as of 02/03/2023  Medication Sig   buPROPion (WELLBUTRIN XL) 150 MG 24 hr tablet Take 1 tablet (150 mg total) by mouth daily.   SUMAtriptan (IMITREX) 50 MG tablet Take 1 tablet (50 mg total) by mouth once as needed for up to 1 dose for migraine. May repeat in 2 hours if headache persists or recurs.   Norgestimate-Ethinyl Estradiol Triphasic (TRI-ESTARYLLA) 0.18/0.215/0.25 MG-35 MCG tablet Take 1 tablet by mouth daily.   [DISCONTINUED] calcium carbonate (TUMS - DOSED IN MG ELEMENTAL CALCIUM) 500 MG chewable tablet Chew 1 tablet by mouth daily.   [DISCONTINUED] Prenatal Vit-Fe Fumarate-FA (MULTIVITAMIN-PRENATAL) 27-0.8 MG TABS tablet Take 1 tablet by mouth daily at 12 noon.   [DISCONTINUED]  promethazine (PHENERGAN) 25 MG tablet Take 1 tablet (25 mg total) by mouth every 6 (six) hours as needed for nausea.   [DISCONTINUED] TRI-ESTARYLLA 0.18/0.215/0.25 MG-35 MCG tablet Take 1 tablet by mouth daily.   No facility-administered encounter medications on file as of 02/03/2023.    Past Medical History:  Diagnosis Date   Borderline personality disorder (HCC) 12/21/2022   Medical history non-contributory     Past Surgical History:  Procedure Laterality Date   WISDOM TOOTH EXTRACTION      Family History  Problem Relation Age of Onset   Anxiety disorder Mother    Anxiety disorder Sister    Heart disease Maternal Grandfather     Social History   Socioeconomic History   Marital status: Single    Spouse name: Not on file   Number of children: Not on file   Years of education: Not on file   Highest education level: Bachelor's degree (e.g., BA, AB, BS)  Occupational History   Occupation: AD retail  Tobacco Use   Smoking status: Never   Smokeless tobacco: Never  Vaping Use   Vaping status: Never Used  Substance and Sexual Activity   Alcohol use: Not Currently    Comment: occasionally   Drug use: Never   Sexual activity: Yes    Birth control/protection: None  Other Topics Concern   Not on file  Social History Narrative   Not on file   Social Drivers of Health   Financial Resource Strain: Low Risk  (02/03/2023)   Overall Financial Resource Strain (CARDIA)  Difficulty of Paying Living Expenses: Not very hard  Food Insecurity: No Food Insecurity (02/03/2023)   Hunger Vital Sign    Worried About Running Out of Food in the Last Year: Never true    Ran Out of Food in the Last Year: Never true  Transportation Needs: No Transportation Needs (02/03/2023)   PRAPARE - Administrator, Civil Service (Medical): No    Lack of Transportation (Non-Medical): No  Physical Activity: Unknown (02/03/2023)   Exercise Vital Sign    Days of Exercise per Week: 0 days     Minutes of Exercise per Session: Not on file  Stress: No Stress Concern Present (02/03/2023)   [employer] of Occupational Health - Occupational Stress Questionnaire    Feeling of Stress : Only a little  Social Connections: Moderately Isolated (02/03/2023)   Social Connection and Isolation Panel [NHANES]    Frequency of Communication with Friends and Family: Once a week    Frequency of Social Gatherings with Friends and Family: Once a week    Attends Religious Services: More than 4 times per year    Active Member of [employer] or Organizations: No    Attends Engineer, structural: Not on file    Marital Status: Living with partner  Intimate Partner Violence: Unknown (04/23/2021)   Received from [employer], [facility]   HITS    Physically Hurt: Not on file    Insult or Talk Down To: Not on file    Threaten Physical Harm: Not on file    Scream or Curse: Not on file    Review of Systems  Respiratory:  Negative for cough and shortness of breath.   Cardiovascular:  Negative for chest pain and palpitations.        Objective    Ht 5\' 3"  (1.6 m)   Wt 107 lb (48.5 kg)   LMP 01/30/2023   Breastfeeding No   BMI 18.95 kg/m   Physical Exam Vitals reviewed.  Constitutional:      General: She is not in acute distress.    Appearance: Normal appearance.  HENT:     Head: Normocephalic and atraumatic.  Neck:     Vascular: No carotid bruit.  Cardiovascular:     Rate and Rhythm: Normal rate and regular rhythm.     Pulses: Normal pulses.     Heart sounds: Normal heart sounds.  Pulmonary:     Effort: Pulmonary effort is normal.     Breath sounds: Normal breath sounds.  Skin:    General: Skin is warm and dry.  Neurological:     General: No focal deficit present.     Mental Status: She is alert and oriented to person, place, and time.  Psychiatric:        Mood and Affect: Mood normal.        Behavior: Behavior normal.        Judgment: Judgment normal.          Assessment & Plan:   Problem List Items Addressed This Visit       Cardiovascular and Mediastinum   Intractable migraine without status migrainosus - Primary   Chronic, intermittent Will try sumatriptan for abortive therapy Patient to follow-up in 1 month to discuss how she tolerates the medication.      Relevant Medications   SUMAtriptan (IMITREX) 50 MG tablet   buPROPion (WELLBUTRIN XL) 150 MG 24 hr tablet     Other   GAD (generalized anxiety disorder)  Chronic Treatment options discussed, per shared decision making we will trial Wellbutrin 150 mg tablet daily.  Patient notified of blackbox warning of suicidal ideation and to call office if this were to occur. Follow-up in 4 to 6 weeks.      Relevant Medications   buPROPion (WELLBUTRIN XL) 150 MG 24 hr tablet   Other Relevant Orders   CBC   Comprehensive metabolic panel   Hemoglobin A1c   Lipid panel   TSH   Diabetes mellitus screening   Labs ordered, further recommendations may be made based upon his results       Relevant Orders   Hemoglobin A1c   Encounter for lipid screening for cardiovascular disease   Labs ordered, further recommendations may be made based upon these results       Relevant Orders   Lipid panel   Family history of cardiovascular disease   Labs ordered, further recommendations may be made based upon these results       Relevant Orders   Lipid panel   Encounter for contraceptive management   Point-of-care pregnancy test negative today. Will refill her oral contraceptive pill.  Patient encouraged to use backup contraception with condom use for 1 week. She was also advised that Wellbutrin and sumatriptan are not generally first line treatment options in pregnancy thus she should reach out before planning another pregnancy and if she has an unplanned pregnancy she should notify us to discuss medication adjustments.  She reports understanding.      Relevant Medications    Norgestimate-Ethinyl Estradiol Triphasic (TRI-ESTARYLLA) 0.18/0.215/0.25 MG-35 MCG tablet    Return in about 1 month (around 03/06/2023) for CPE with Maralyn Sago.   Elenore Paddy, NP

## 2023-03-10 ENCOUNTER — Ambulatory Visit: Payer: 59 | Admitting: Nurse Practitioner

## 2023-04-21 ENCOUNTER — Telehealth: Admitting: Physician Assistant

## 2023-04-21 ENCOUNTER — Telehealth

## 2023-04-21 DIAGNOSIS — R3989 Other symptoms and signs involving the genitourinary system: Secondary | ICD-10-CM | POA: Diagnosis not present

## 2023-04-21 MED ORDER — SULFAMETHOXAZOLE-TRIMETHOPRIM 800-160 MG PO TABS
1.0000 | ORAL_TABLET | Freq: Two times a day (BID) | ORAL | 0 refills | Status: DC
Start: 2023-04-21 — End: 2023-12-02

## 2023-04-21 NOTE — Patient Instructions (Signed)
 Diana Mejia, thank you for joining [name], PA-C for today's virtual visit.  While this provider is not your primary care provider (PCP), if your PCP is located in our provider database this encounter information will be shared with them immediately following your visit.   A [facility] MyChart account gives you access to today's visit and all your visits, tests, and labs performed at [facility] " click here if you don't have a [facility] MyChart account or go to [email or web address]  Consent: (Patient) Diana Mejia provided verbal consent for this virtual visit at the beginning of the encounter.  Current Medications:  Current Outpatient Medications:    sulfamethoxazole-trimethoprim (BACTRIM DS) 800-160 MG tablet, Take 1 tablet by mouth 2 (two) times daily., Disp: 10 tablet, Rfl: 0   buPROPion (WELLBUTRIN XL) 150 MG 24 hr tablet, Take 1 tablet (150 mg total) by mouth daily., Disp: 30 tablet, Rfl: 1   Norgestimate-Ethinyl Estradiol Triphasic (TRI-ESTARYLLA) 0.18/0.215/0.25 MG-35 MCG tablet, Take 1 tablet by mouth daily., Disp: 28 tablet, Rfl: 11   SUMAtriptan (IMITREX) 50 MG tablet, Take 1 tablet (50 mg total) by mouth once as needed for up to 1 dose for migraine. May repeat in 2 hours if headache persists or recurs., Disp: 10 tablet, Rfl: 0   Medications ordered in this encounter:  Meds ordered this encounter  Medications   sulfamethoxazole-trimethoprim (BACTRIM DS) 800-160 MG tablet    Sig: Take 1 tablet by mouth 2 (two) times daily.    Dispense:  10 tablet    Refill:  0    Supervising Provider:   Merrilee Jansky [[id number]]     *If you need refills on other medications prior to your next appointment, please contact your pharmacy*  Follow-Up: Call back or seek an in-person evaluation if the symptoms worsen or if the condition fails to improve as anticipated.  [facility] [phone]  Other Instructions Your symptoms are  consistent with a bladder infection, also called acute cystitis. Please take your antibiotic (Bactrim) as directed until all pills are gone.  Stay very well hydrated.  Consider a daily probiotic (Align, Culturelle, or Activia) to help prevent stomach upset caused by the antibiotic.  Taking a probiotic daily may also help prevent recurrent UTIs.  Also consider taking AZO (Phenazopyridine) tablets to help decrease pain with urination.    Urinary Tract Infection A urinary tract infection (UTI) can occur any place along the urinary tract. The tract includes the kidneys, ureters, bladder, and urethra. A type of germ called bacteria often causes a UTI. UTIs are often helped with antibiotic medicine.  HOME CARE  If given, take antibiotics as told by your doctor. Finish them even if you start to feel better. Drink enough fluids to keep your pee (urine) clear or pale yellow. Avoid tea, drinks with caffeine, and bubbly (carbonated) drinks. Pee often. Avoid holding your pee in for a long time. Pee before and after having sex (intercourse). Wipe from front to back after you poop (bowel movement) if you are a woman. Use each tissue only once. GET HELP RIGHT AWAY IF:  You have back pain. You have lower belly (abdominal) pain. You have chills. You feel sick to your stomach (nauseous). You throw up (vomit). Your burning or discomfort with peeing does not go away. You have a fever. Your symptoms are not better in 3 days. MAKE SURE YOU:  Understand these instructions. Will watch your condition. Will get help right away if you are not  doing well or get worse. Document Released: 06/23/2007 Document Revised: 09/29/2011 Document Reviewed: 08/05/2011 [facility] Patient Information 2015 [place], [place]. This information is not intended to replace advice given to you by your health care provider. Make sure you discuss any questions you have with your health care provider.    If you have been instructed to have an  in-person evaluation today at a local Urgent Care facility, please use the link below. It will take you to a list of all of our available [facility], including address, phone number and hours of operation. Please do not delay care.  [facility]  If you or a family member do not have a primary care provider, use the link below to schedule a visit and establish care. When you choose a [facility] primary care physician or advanced practice provider, you gain a long-term partner in health. Find a Primary Care Provider  Learn more about [facility]'s in-office and virtual care options:  - Get Care Now

## 2023-04-21 NOTE — Progress Notes (Signed)
 Virtual Visit Consent   Diana Mejia, you are scheduled for a virtual visit with a [facility] provider today. Just as with appointments in the office, your consent must be obtained to participate. Your consent will be active for this visit and any virtual visit you may have with one of our providers in the next 365 days. If you have a MyChart account, a copy of this consent can be sent to you electronically.  As this is a virtual visit, video technology does not allow for your provider to perform a traditional examination. This may limit your provider's ability to fully assess your condition. If your provider identifies any concerns that need to be evaluated in person or the need to arrange testing (such as labs, EKG, etc.), we will make arrangements to do so. Although advances in technology are sophisticated, we cannot ensure that it will always work on either your end or our end. If the connection with a video visit is poor, the visit may have to be switched to a telephone visit. With either a video or telephone visit, we are not always able to ensure that we have a secure connection.  By engaging in this virtual visit, you consent to the provision of healthcare and authorize for your insurance to be billed (if applicable) for the services provided during this visit. Depending on your insurance coverage, you may receive a charge related to this service.  I need to obtain your verbal consent now. Are you willing to proceed with your visit today? [name] has provided verbal consent on 04/21/2023 for a virtual visit (video or telephone). [name], [place]  Date: 04/21/2023 4:17 PM   Virtual Visit via Video Note   I, [name], connected with  Nannette Zill  ([id number], Mar 26, 1995) on 04/21/23 at  4:15 PM EDT by a video-enabled telemedicine application and verified that I am speaking with the correct person using two identifiers.  Location: Patient: Virtual Visit Location  Patient: Home Provider: Virtual Visit Location Provider: Home Office   I discussed the limitations of evaluation and management by telemedicine and the availability of in person appointments. The patient expressed understanding and agreed to proceed.    History of Present Illness: Diana Mejia is a 28 y.o. who identifies as a female who was assigned female at birth, and is being seen today for possible UTI. Starting to note increased urinary urgency, frequency and mild dysuria/burning after returning from her honeymoon. Took AZO vitamins OTC with some improvement but now returning with some low back pain. Denies fever, chills, nausea/vomiting. Denies belly pain.  LMP -- just ended today. Diana Mejia  HPI: HPI  Problems:  Patient Active Problem List   Diagnosis Date Noted   Intractable migraine without status migrainosus 02/03/2023   GAD (generalized anxiety disorder) 02/03/2023   Diabetes mellitus screening 02/03/2023   Encounter for lipid screening for cardiovascular disease 02/03/2023   Family history of cardiovascular disease 02/03/2023   Encounter for contraceptive management 02/03/2023   Diet controlled gestational diabetes mellitus (GDM) in third trimester 11/01/2019   Supervision of normal pregnancy 05/29/2019    Allergies:  Allergies  Allergen Reactions   Penicillins Hives   Medications:  Current Outpatient Medications:    sulfamethoxazole-trimethoprim (BACTRIM DS) 800-160 MG tablet, Take 1 tablet by mouth 2 (two) times daily., Disp: 10 tablet, Rfl: 0   buPROPion (WELLBUTRIN XL) 150 MG 24 hr tablet, Take 1 tablet (150 mg total) by mouth daily., Disp: 30 tablet, Rfl: 1  Norgestimate-Ethinyl Estradiol Triphasic (TRI-ESTARYLLA) 0.18/0.215/0.25 MG-35 MCG tablet, Take 1 tablet by mouth daily., Disp: 28 tablet, Rfl: 11   SUMAtriptan (IMITREX) 50 MG tablet, Take 1 tablet (50 mg total) by mouth once as needed for up to 1 dose for migraine. May repeat in 2 hours if headache persists or  recurs., Disp: 10 tablet, Rfl: 0  Observations/Objective: Patient is well-developed, well-nourished in no acute distress.  Resting comfortably at home.  Head is normocephalic, atraumatic.  No labored breathing. Speech is clear and coherent with logical content.  Patient is alert and oriented at baseline.   Assessment and Plan: 1. Suspected UTI (Primary) - sulfamethoxazole-trimethoprim (BACTRIM DS) 800-160 MG tablet; Take 1 tablet by mouth 2 (two) times daily.  Dispense: 10 tablet; Refill: 0  Classic UTI symptoms with absence of alarm signs or symptoms. Prior history of UTI. Will treat empirically with Bactrim for suspected uncomplicated cystitis. Supportive measures and OTC medications reviewed. Strict in-person evaluation precautions discussed.    Follow Up Instructions: I discussed the assessment and treatment plan with the patient. The patient was provided an opportunity to ask questions and all were answered. The patient agreed with the plan and demonstrated an understanding of the instructions.  A copy of instructions were sent to the patient via MyChart unless otherwise noted below.   The patient was advised to call back or seek an in-person evaluation if the symptoms worsen or if the condition fails to improve as anticipated.    [name], PA-C

## 2023-11-18 ENCOUNTER — Ambulatory Visit

## 2023-11-18 VITALS — BP 108/72 | HR 79

## 2023-11-18 DIAGNOSIS — Z3201 Encounter for pregnancy test, result positive: Secondary | ICD-10-CM

## 2023-11-18 LAB — POCT URINE PREGNANCY: Preg Test, Ur: POSITIVE — AB

## 2023-11-18 MED ORDER — PROMETHAZINE HCL 25 MG PO TABS: 25.0000 mg | ORAL_TABLET | Freq: Four times a day (QID) | ORAL | 1 refills | Status: AC | PRN

## 2023-11-18 MED ORDER — PRENATAL 28-0.8 MG PO TABS: 1.0000 | ORAL_TABLET | Freq: Every day | ORAL | 12 refills | Status: AC

## 2023-11-18 NOTE — Progress Notes (Signed)
 [facility] here for a UPT. Pt had a positive upt at home. LMP is 09/29/23.     UPT in office Positive.    Reviewed medications and informed to start a PNV, if not already. Pt to follow up in 2 weeks for New OB visit.

## 2023-11-21 ENCOUNTER — Other Ambulatory Visit: Payer: Self-pay

## 2023-11-21 MED ORDER — DOXYLAMINE-PYRIDOXINE 10-10 MG PO TBEC
2.0000 | DELAYED_RELEASE_TABLET | Freq: Every day | ORAL | 5 refills | Status: AC
Start: 2023-11-21 — End: ?

## 2023-12-01 ENCOUNTER — Encounter: Payer: Self-pay | Admitting: *Deleted

## 2023-12-02 ENCOUNTER — Other Ambulatory Visit (INDEPENDENT_AMBULATORY_CARE_PROVIDER_SITE_OTHER): Payer: Self-pay

## 2023-12-02 ENCOUNTER — Other Ambulatory Visit (HOSPITAL_COMMUNITY)
Admission: RE | Admit: 2023-12-02 | Discharge: 2023-12-02 | Disposition: A | Discharge status: Home / Self Care | Source: Ambulatory Visit | Attending: Obstetrics and Gynecology | Admitting: Obstetrics and Gynecology

## 2023-12-02 ENCOUNTER — Ambulatory Visit: Admitting: *Deleted

## 2023-12-02 VITALS — BP 109/66 | HR 91 | Wt 107.3 lb

## 2023-12-02 DIAGNOSIS — O3680X0 Pregnancy with inconclusive fetal viability, not applicable or unspecified: Secondary | ICD-10-CM

## 2023-12-02 DIAGNOSIS — Z3481 Encounter for supervision of other normal pregnancy, first trimester: Secondary | ICD-10-CM | POA: Diagnosis not present

## 2023-12-02 DIAGNOSIS — Z3A08 8 weeks gestation of pregnancy: Secondary | ICD-10-CM | POA: Diagnosis not present

## 2023-12-02 DIAGNOSIS — Z3480 Encounter for supervision of other normal pregnancy, unspecified trimester: Secondary | ICD-10-CM | POA: Diagnosis present

## 2023-12-02 DIAGNOSIS — N898 Other specified noninflammatory disorders of vagina: Secondary | ICD-10-CM

## 2023-12-02 MED ORDER — FAMOTIDINE 20 MG PO TABS
20.0000 mg | ORAL_TABLET | Freq: Two times a day (BID) | ORAL | 3 refills | Status: AC
Start: 2023-12-02 — End: ?

## 2023-12-02 NOTE — Patient Instructions (Signed)
[facility] has a partnership with the [employer] to provide prenatal navigation for the most needed resources in our community. In order to see how we can help connect you to these resources we need consent to contact you. Please complete the very short consent using the link below:   English Link: [email or web address]  Spanish Link: [email or web address].[email or web address]?[email or web address]  

## 2023-12-02 NOTE — Progress Notes (Signed)
 New OB Intake  I connected with Diana Mejia  on 12/02/23 at  9:15 AM EST by In Person Visit and verified that I am speaking with the correct person using two identifiers. Nurse is located at [facility] and pt is located at [facility].  I discussed the limitations, risks, security and privacy concerns of performing an evaluation and management service by telephone and the availability of in person appointments. I also discussed with the patient that there may be a patient responsible charge related to this service. The patient expressed understanding and agreed to proceed.  I explained I am completing New OB Intake today. We discussed EDD of 07/13/24 based on US  at [redacted]w[redacted]d weeks. Pt is G2P1001. I reviewed her allergies, medications and Medical/Surgical/OB history.    Patient Active Problem List   Diagnosis Date Noted   Supervision of other normal pregnancy, antepartum 12/02/2023   Intractable migraine without status migrainosus 02/03/2023   GAD (generalized anxiety disorder) 02/03/2023   Diabetes mellitus screening 02/03/2023   Encounter for lipid screening for cardiovascular disease 02/03/2023   Family history of cardiovascular disease 02/03/2023   Encounter for contraceptive management 02/03/2023   Diet controlled gestational diabetes mellitus (GDM) in third trimester 11/01/2019   Supervision of normal pregnancy 05/29/2019     Concerns addressed today  Delivery Plans Plans to deliver at [facility]. Discussed the nature of our practice with multiple providers including residents and students as well as female and female providers. Due to the size of the practice, the delivering provider may not be the same as those providing prenatal care.   Patient is not interested in water birth.  MyChart/Babyscripts MyChart access verified. I explained pt will have some visits in office and some virtually. Babyscripts instructions given and order placed. Patient verifies receipt of registration text/e-mail.  Account successfully created and app downloaded. If patient is a candidate for Optimized scheduling, add to sticky note.   Blood Pressure Cuff/Weight Scale Has BP cuff.  Explained after first prenatal appt pt will check weekly and document in Babyscripts. Patient does not have weight scale; patient may purchase if they desire to track weight weekly in Babyscripts.  Anatomy US  Explained first scheduled US  will be around 19 weeks. Anatomy US  scheduled for TBD at TBD.  Is patient a candidate for Babyscripts Optimization?Yes   First visit review I reviewed new OB appt with patient. Explained pt will be seen by Nidia Daring, NP at first visit. Discussed [facility] genetic screening with patient. Requests Panorama and Horizon.. Routine prenatal labs OB Urine, GC/CC collected at today's visit. Initial prenatal labs deferred to New OB appt.   Last Pap No results found for: DIAGPAP  Diana CHRISTELLA Ober, RN 12/02/2023  10:08 AM

## 2023-12-04 LAB — CULTURE, OB URINE

## 2023-12-04 LAB — URINE CULTURE, OB REFLEX: Organism ID, Bacteria: NO GROWTH

## 2023-12-05 ENCOUNTER — Ambulatory Visit: Payer: Self-pay | Admitting: Obstetrics and Gynecology

## 2023-12-07 LAB — CERVICOVAGINAL ANCILLARY ONLY
Bacterial Vaginitis (gardnerella): POSITIVE — AB
Chlamydia: NEGATIVE
Comment: NEGATIVE
Comment: NEGATIVE
Comment: NEGATIVE
Comment: NEGATIVE
Comment: NORMAL
Neisseria Gonorrhea: NEGATIVE

## 2023-12-07 MED ORDER — METRONIDAZOLE 500 MG PO TABS
500.0000 mg | ORAL_TABLET | Freq: Two times a day (BID) | ORAL | 0 refills | Status: DC
Start: 2023-12-07 — End: 2023-12-13

## 2023-12-13 ENCOUNTER — Other Ambulatory Visit: Payer: Self-pay

## 2023-12-13 MED ORDER — METRONIDAZOLE 0.75 % VA GEL: 1.0000 | Freq: Every day | VAGINAL | 0 refills | Status: AC

## 2024-01-02 ENCOUNTER — Encounter: Admitting: Obstetrics and Gynecology

## 2024-01-04 ENCOUNTER — Ambulatory Visit: Admitting: Obstetrics and Gynecology

## 2024-01-04 ENCOUNTER — Other Ambulatory Visit (HOSPITAL_COMMUNITY)
Admission: RE | Admit: 2024-01-04 | Discharge: 2024-01-04 | Disposition: A | Discharge status: Home / Self Care | Source: Ambulatory Visit | Attending: Obstetrics and Gynecology | Admitting: Obstetrics and Gynecology

## 2024-01-04 ENCOUNTER — Encounter: Payer: Self-pay | Admitting: Obstetrics and Gynecology

## 2024-01-04 VITALS — BP 108/76 | HR 96 | Wt 109.6 lb

## 2024-01-04 DIAGNOSIS — Z3A12 12 weeks gestation of pregnancy: Secondary | ICD-10-CM

## 2024-01-04 DIAGNOSIS — Z3480 Encounter for supervision of other normal pregnancy, unspecified trimester: Secondary | ICD-10-CM | POA: Insufficient documentation

## 2024-01-04 DIAGNOSIS — O219 Vomiting of pregnancy, unspecified: Secondary | ICD-10-CM | POA: Diagnosis not present

## 2024-01-04 MED ORDER — ONDANSETRON 4 MG PO TBDP: 4.0000 mg | ORAL_TABLET | Freq: Four times a day (QID) | ORAL | 1 refills | Status: AC | PRN

## 2024-01-04 NOTE — Progress Notes (Signed)
 Pt presents for NOB visit. No concerns

## 2024-01-04 NOTE — Progress Notes (Signed)
 INITIAL PRENATAL VISIT  Subjective:   Diana Mejia is being seen today for her first obstetrical visit.  She is at [redacted]w[redacted]d gestation by ultrasound. Her obstetrical history is significant for none. Relationship with FOB: spouse, living together. Patient does intend to breast feed. Pregnancy history fully reviewed.  Patient reports reports nausea and vomitng, having to be out of work when it gets really bad.  Indications for ASA therapy (per uptodate)   Pap smear history: 2021 normal Objective:    Obstetric History OB History  Gravida Para Term Preterm AB Living  2 1 1  0 0 1  SAB IAB Ectopic Multiple Live Births  0 0 0 0 1    # Outcome Date GA Lbr Len/2nd Weight Sex Type Anes PTL Lv  2 Current           1 Term 01/11/20 [redacted]w[redacted]d 07:25 / 00:18 6 lb 15.5 oz (3.16 kg) F Vag-Spont  N LIV    Past Medical History:  Diagnosis Date   Borderline personality disorder (HCC) 12/21/2022   Medical history non-contributory     Past Surgical History:  Procedure Laterality Date   WISDOM TOOTH EXTRACTION      Medications Ordered Prior to Encounter[1]  Allergies[2]  Social History:  reports that she has never smoked. She has never used smokeless tobacco. She reports that she does not currently use alcohol. She reports that she does not use drugs.  Family History  Problem Relation Age of Onset   Anxiety disorder Mother    Heart Problems Mother    Healthy Father    Anxiety disorder Sister    Heart disease Maternal Grandfather     The following portions of the patient's history were reviewed and updated as appropriate: allergies, current medications, past family history, past medical history, past social history, past surgical history and problem list.  Review of Systems Review of Systems  Gastrointestinal:  Positive for nausea.  All other systems reviewed and are negative.    Physical Exam:  BP 108/76   Pulse 96   Wt 109 lb 9.6 oz (49.7 kg)   LMP 09/29/2023 (Exact Date)    BMI 19.41 kg/m  CONSTITUTIONAL: Well-developed, well-nourished female in no acute distress.  HENT:  Normocephalic, atraumatic.   SKIN: Skin is warm and dry. MUSCULOSKELETAL: Normal range of motion NEUROLOGIC: Alert and oriented  PSYCHIATRIC: Normal mood and affect. Normal behavior.  RESPIRATORY: normal effort ABDOMEN: Soft PELVIC:Pelvic: normal appearing vulva with no masses, tenderness or lesions  VAGINA: normal appearing vagina with normal color and discharge, no lesions  CERVIX: normal appearing cervix without discharge or lesions, no CMT  Thin prep pap is done   Extremities:  No swelling or varicosities noted   Fetal Heart Rate (bpm): 154   Movement: Present       Assessment:    Pregnancy: G2P1001  1. Supervision of other normal pregnancy, antepartum (Primary) BP and FHR normal Pap today   - Cytology - PAP( [facility]) - CBC/D/Plt+RPR+Rh+ABO+RubIgG... - HgB A1c - PANORAMA PRENATAL TEST - HORIZON Basic Panel  2. [redacted] weeks gestation of pregnancy Reports last pregancy she was misdiagnosed GDM with her numbers for the 3 hour., so she did not end of having GDM   3. Nausea and vomiting during pregnancy Discussed trial of zofran  Discussed intermittent FMLA for job for her n/v if symptoms do not resolve, she cannot continue missing work. Discussed obtaining FMLA paperwork tobe out 2-3 days a month if smypomts are nor improved with supportive  measures and medication. She will bring paperwork  - ondansetron  (ZOFRAN -ODT) 4 MG disintegrating tablet; Take 1 tablet (4 mg total) by mouth every 6 (six) hours as needed for nausea.  Dispense: 20 tablet; Refill: 1     Plan:     Initial labs drawn. Prenatal vitamins. Problem list reviewed and updated. Reviewed in detail the nature of the practice with collaborative care between  Genetic screening discussed: NIPS/First trimester screen/Quad/AFP ordered. Role of ultrasound in pregnancy discussed; Anatomy US : ordered. Discussed clinic  routines, schedule of care and testing, genetic screening options, involvement of students and residents under the direct supervision of APPs and doctors and presence of female providers. Pt verbalized understanding.   Delores Nidia CROME, FNP       [1]  Current Outpatient Medications on File Prior to Visit  Medication Sig Dispense Refill   famotidine  (PEPCID ) 20 MG tablet Take 1 tablet (20 mg total) by mouth 2 (two) times daily. 60 tablet 3   Prenatal 28-0.8 MG TABS Take 1 tablet by mouth daily. 30 tablet 12   promethazine  (PHENERGAN ) 25 MG tablet Take 1 tablet (25 mg total) by mouth every 6 (six) hours as needed for nausea or vomiting. 30 tablet 1   buPROPion  (WELLBUTRIN  XL) 150 MG 24 hr tablet Take 1 tablet (150 mg total) by mouth daily. (Patient not taking: Reported on 01/04/2024) 30 tablet 1   Doxylamine -Pyridoxine  (DICLEGIS ) 10-10 MG TBEC Take 2 tablets by mouth at bedtime. If symptoms persist, add one tablet in the morning and one in the afternoon (Patient not taking: Reported on 01/04/2024) 100 tablet 5   metroNIDAZOLE  (METROGEL ) 0.75 % vaginal gel Place 1 Applicatorful vaginally at bedtime. Apply one applicatorful to vagina at bedtime for 5 days (Patient not taking: Reported on 01/04/2024) 70 g 0   SUMAtriptan  (IMITREX ) 50 MG tablet Take 1 tablet (50 mg total) by mouth once as needed for up to 1 dose for migraine. May repeat in 2 hours if headache persists or recurs. (Patient not taking: Reported on 01/04/2024) 10 tablet 0   No current facility-administered medications on file prior to visit.  [2]  Allergies Allergen Reactions   Penicillins Hives

## 2024-01-05 DIAGNOSIS — Z3480 Encounter for supervision of other normal pregnancy, unspecified trimester: Secondary | ICD-10-CM

## 2024-01-05 LAB — CBC/D/PLT+RPR+RH+ABO+RUBIGG...
Antibody Screen: NEGATIVE
Basophils Absolute: 0 x10E3/uL (ref 0.0–0.2)
Basos: 0 %
EOS (ABSOLUTE): 0.2 x10E3/uL (ref 0.0–0.4)
Eos: 2 %
HCV Ab: NONREACTIVE
HIV Screen 4th Generation wRfx: NONREACTIVE
Hematocrit: 40.4 % (ref 34.0–46.6)
Hemoglobin: 13.4 g/dL (ref 11.1–15.9)
Hepatitis B Surface Ag: NEGATIVE
Immature Grans (Abs): 0.1 x10E3/uL (ref 0.0–0.1)
Immature Granulocytes: 1 %
Lymphocytes Absolute: 1.4 x10E3/uL (ref 0.7–3.1)
Lymphs: 13 %
MCH: 28.8 pg (ref 26.6–33.0)
MCHC: 33.2 g/dL (ref 31.5–35.7)
MCV: 87 fL (ref 79–97)
Monocytes Absolute: 0.7 x10E3/uL (ref 0.1–0.9)
Monocytes: 7 %
Neutrophils Absolute: 8.4 x10E3/uL — ABNORMAL HIGH (ref 1.4–7.0)
Neutrophils: 77 %
Platelets: 296 x10E3/uL (ref 150–450)
RBC: 4.65 x10E6/uL (ref 3.77–5.28)
RDW: 13.6 % (ref 11.7–15.4)
RPR Ser Ql: NONREACTIVE
Rh Factor: NEGATIVE
Rubella Antibodies, IGG: 8.76 {index} (ref >0.99)
WBC: 10.8 x10E3/uL (ref 3.4–10.8)

## 2024-01-05 LAB — HCV INTERPRETATION

## 2024-01-05 LAB — HEMOGLOBIN A1C
Est. average glucose Bld gHb Est-mCnc: 94 mg/dL
Hgb A1c MFr Bld: 4.9 % (ref 4.8–5.6)

## 2024-01-09 LAB — CYTOLOGY - PAP: Diagnosis: NEGATIVE

## 2024-01-10 LAB — PANORAMA PRENATAL TEST FULL PANEL:PANORAMA TEST PLUS 5 ADDITIONAL MICRODELETIONS: FETAL FRACTION: 10.9

## 2024-01-15 LAB — HORIZON CUSTOM: REPORT SUMMARY: NEGATIVE

## 2024-01-25 ENCOUNTER — Telehealth: Payer: Self-pay

## 2024-01-25 NOTE — Telephone Encounter (Signed)
 Pt returned call, confirmed changes to be made with additional dates/time for intermittent leave, [facility] FNP approved

## 2024-01-25 NOTE — Telephone Encounter (Signed)
 Attempted to return call about paperwork, no answer, left vm

## 2024-01-30 ENCOUNTER — Ambulatory Visit: Admitting: Obstetrics and Gynecology

## 2024-01-30 ENCOUNTER — Encounter: Payer: Self-pay | Admitting: Obstetrics and Gynecology

## 2024-01-30 VITALS — BP 100/69 | HR 76 | Wt 114.2 lb

## 2024-01-30 DIAGNOSIS — Z3480 Encounter for supervision of other normal pregnancy, unspecified trimester: Secondary | ICD-10-CM

## 2024-01-30 DIAGNOSIS — Z3482 Encounter for supervision of other normal pregnancy, second trimester: Secondary | ICD-10-CM

## 2024-01-30 DIAGNOSIS — L709 Acne, unspecified: Secondary | ICD-10-CM

## 2024-01-30 DIAGNOSIS — Z3A16 16 weeks gestation of pregnancy: Secondary | ICD-10-CM

## 2024-01-30 MED ORDER — CLINDAMYCIN PHOS (TWICE-DAILY) 1 % EX GEL: Freq: Two times a day (BID) | CUTANEOUS | 0 refills | Status: AC

## 2024-01-30 NOTE — Addendum Note (Signed)
 Addended by: DELORES NIDIA CROME on: 01/30/2024 01:39 PM   Modules accepted: Orders

## 2024-01-30 NOTE — Progress Notes (Signed)
 ROB  Pt would like to discuss AFP lab with provider today.   Pt has questions about FMLA paperwork.

## 2024-01-30 NOTE — Progress Notes (Addendum)
" ° °  PRENATAL VISIT NOTE  Subjective:  Diana Mejia is a 29 y.o. G2P1001 at [redacted]w[redacted]d being seen today for ongoing prenatal care.  She is currently monitored for the following issues for this low-risk pregnancy and has Intractable migraine without status migrainosus; GAD (generalized anxiety disorder); Encounter for lipid screening for cardiovascular disease; Family history of cardiovascular disease; and Supervision of other normal pregnancy, antepartum on their problem list.  Patient reports no complaints.  Contractions: Not present. Vag. Bleeding: None.  Movement: Present. Denies leaking of fluid.   The following portions of the patient's history were reviewed and updated as appropriate: allergies, current medications, past family history, past medical history, past social history, past surgical history and problem list.   Objective:   Vitals:   01/30/24 0858  BP: 100/69  Pulse: 76  Weight: 114 lb 3.2 oz (51.8 kg)    Fetal Status:  Fetal Heart Rate (bpm): 138   Movement: Present    General: Alert, oriented and cooperative. Patient is in no acute distress.  Skin: Skin is warm and dry. No rash noted.   Cardiovascular: Normal heart rate noted  Respiratory: Normal respiratory effort, no problems with respiration noted  Abdomen: Soft, gravid, appropriate for gestational age.  Pain/Pressure: Absent     Pelvic: Cervical exam deferred        Extremities: Normal range of motion.  Edema: None  Mental Status: Normal mood and affect. Normal behavior. Normal judgment and thought content.   Assessment and Plan:  Pregnancy: G2P1001 at [redacted]w[redacted]d 1. Supervision of other normal pregnancy, antepartum (Primary) Bp and FHR normal  2. [redacted] weeks gestation of pregnancy Anatomy u/s on 2/2 Plan AFP next visit   3. Acne, unspecified acne type Reports worsening during pregnancy Encourage benzoyl peroxide wash, moisturizer  Desires topical tx as well  - clindamycin  (CLINDAGEL) 1 % gel; Apply topically 2  (two) times daily.  Dispense: 30 g; Refill: 0   Preterm labor symptoms and general obstetric precautions including but not limited to vaginal bleeding, contractions, leaking of fluid and fetal movement were reviewed in detail with the patient. Please refer to After Visit Summary for other counseling recommendations.    Future Appointments  Date Time Provider Department Center  02/20/2024  8:00 AM [facility] PROVIDER 1 [facility]  02/20/2024  8:30 AM [facility] US3 [facility]  02/27/2024  9:35 AM Delores Nidia CROME, FNP [facility] None    Nidia Delores, FNP  "

## 2024-02-01 ENCOUNTER — Encounter: Payer: Self-pay | Admitting: Obstetrics and Gynecology

## 2024-02-20 ENCOUNTER — Ambulatory Visit

## 2024-02-27 ENCOUNTER — Encounter: Admitting: Certified Nurse Midwife

## 2024-02-28 ENCOUNTER — Encounter: Payer: Self-pay | Admitting: Certified Nurse Midwife

## 2024-03-19 ENCOUNTER — Other Ambulatory Visit
